# Patient Record
Sex: Female | Born: 1980 | Race: Black or African American | Hispanic: No | Marital: Single | Smoking: Never smoker
Health system: Southern US, Community
[De-identification: ages and names within clinical notes are randomized; demographics above are authoritative.]

## PROBLEM LIST (undated history)

## (undated) ENCOUNTER — Inpatient Hospital Stay (HOSPITAL_COMMUNITY): Payer: Self-pay

## (undated) DIAGNOSIS — Z903 Acquired absence of stomach [part of]: Secondary | ICD-10-CM

## (undated) DIAGNOSIS — Z789 Other specified health status: Secondary | ICD-10-CM

## (undated) HISTORY — PX: LAPAROSCOPIC GASTRIC SLEEVE RESECTION: SHX5895

---

## 2000-02-20 ENCOUNTER — Emergency Department (HOSPITAL_COMMUNITY): Admission: EM | Admit: 2000-02-20 | Discharge: 2000-02-20 | Payer: Self-pay | Admitting: *Deleted

## 2000-07-26 ENCOUNTER — Emergency Department (HOSPITAL_COMMUNITY): Admission: EM | Admit: 2000-07-26 | Discharge: 2000-07-26 | Payer: Self-pay | Admitting: Emergency Medicine

## 2000-10-03 ENCOUNTER — Other Ambulatory Visit: Admission: RE | Admit: 2000-10-03 | Discharge: 2000-10-03 | Payer: Self-pay | Admitting: Obstetrics and Gynecology

## 2001-12-15 ENCOUNTER — Emergency Department (HOSPITAL_COMMUNITY): Admission: EM | Admit: 2001-12-15 | Discharge: 2001-12-15 | Payer: Self-pay | Admitting: Emergency Medicine

## 2001-12-17 ENCOUNTER — Emergency Department (HOSPITAL_COMMUNITY): Admission: EM | Admit: 2001-12-17 | Discharge: 2001-12-17 | Payer: Self-pay | Admitting: *Deleted

## 2002-11-09 ENCOUNTER — Emergency Department (HOSPITAL_COMMUNITY): Admission: EM | Admit: 2002-11-09 | Discharge: 2002-11-10 | Payer: Self-pay | Admitting: Emergency Medicine

## 2002-11-10 ENCOUNTER — Encounter: Payer: Self-pay | Admitting: Emergency Medicine

## 2003-10-03 ENCOUNTER — Other Ambulatory Visit: Admission: RE | Admit: 2003-10-03 | Discharge: 2003-10-03 | Payer: Self-pay | Admitting: Obstetrics and Gynecology

## 2009-10-07 ENCOUNTER — Inpatient Hospital Stay (HOSPITAL_COMMUNITY): Admission: AD | Admit: 2009-10-07 | Discharge: 2009-10-07 | Payer: Self-pay | Admitting: Obstetrics and Gynecology

## 2009-11-09 ENCOUNTER — Ambulatory Visit (HOSPITAL_COMMUNITY): Admission: RE | Admit: 2009-11-09 | Discharge: 2009-11-09 | Payer: Self-pay | Admitting: Obstetrics

## 2010-01-20 ENCOUNTER — Ambulatory Visit (HOSPITAL_COMMUNITY): Admission: RE | Admit: 2010-01-20 | Discharge: 2010-01-20 | Payer: Self-pay | Admitting: Obstetrics

## 2010-04-06 ENCOUNTER — Ambulatory Visit (HOSPITAL_COMMUNITY): Admission: RE | Admit: 2010-04-06 | Discharge: 2010-04-06 | Payer: Self-pay | Admitting: Obstetrics

## 2010-05-12 ENCOUNTER — Ambulatory Visit (HOSPITAL_COMMUNITY): Admission: RE | Admit: 2010-05-12 | Discharge: 2010-05-12 | Payer: Self-pay | Admitting: Obstetrics

## 2010-05-25 ENCOUNTER — Inpatient Hospital Stay (HOSPITAL_COMMUNITY): Admission: AD | Admit: 2010-05-25 | Discharge: 2010-05-30 | Payer: Self-pay | Admitting: Obstetrics

## 2010-10-31 IMAGING — US US OB COMP LESS 14 WK
1 series · 14 of 19 positions shown · non-contrast
Comparison: none

OBSTETRICAL ULTRASOUND:
 This ultrasound exam was performed in the [HOSPITAL] Ultrasound Department.  The OB US report was generated in the AS system, and faxed to the ordering physician.  This report is also available in [HOSPITAL]?s Access[employer]ware and in [REDACTED] PACS.

[Series 1: us ob comp less 14 wks · 0.22mm/px · 14 of 19 slices shown]
[im 1/19]
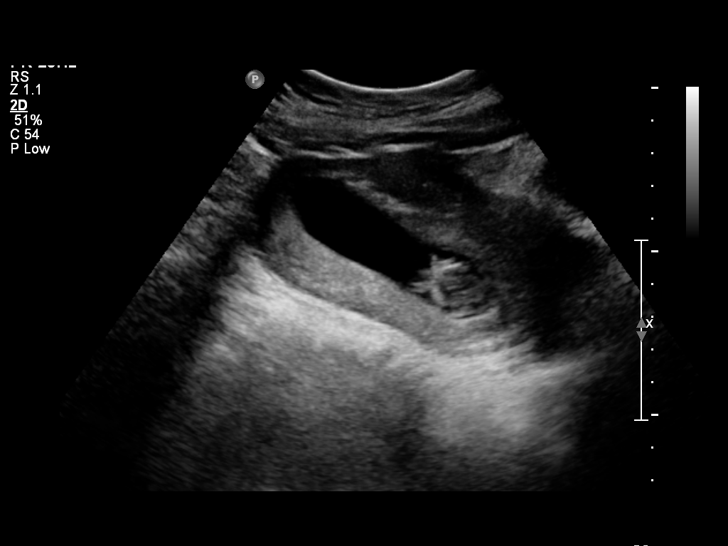
[im 3/19]
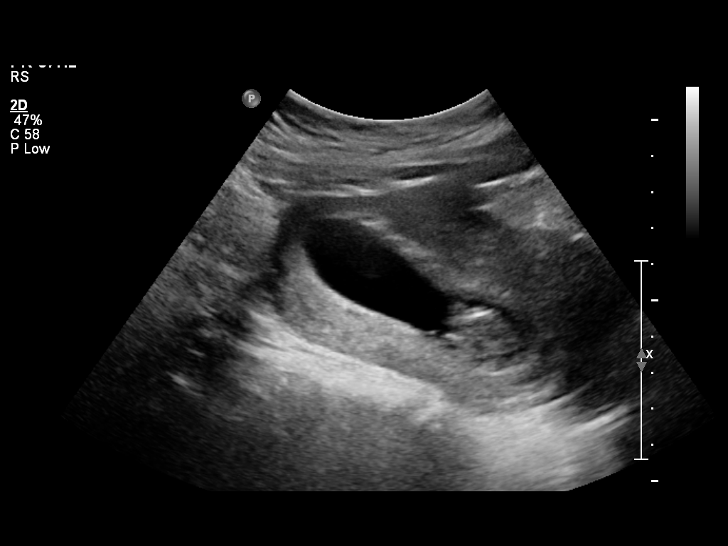
[im 4/19]
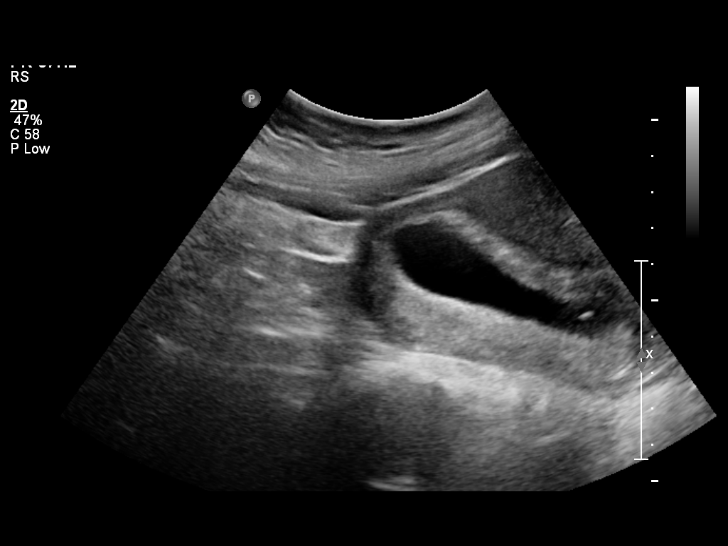
[im 5/19]
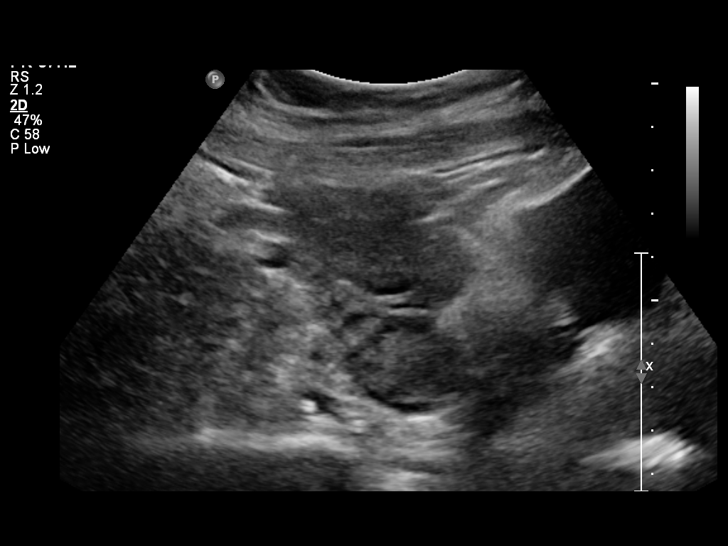
[im 7/19]
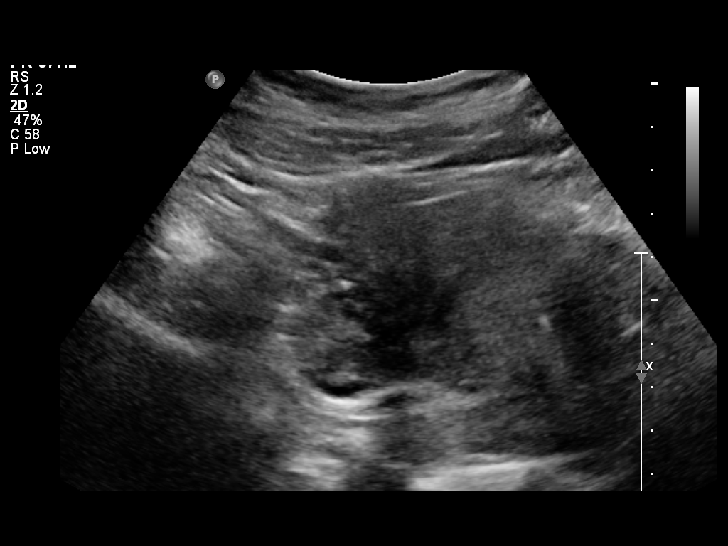
[im 8/19]
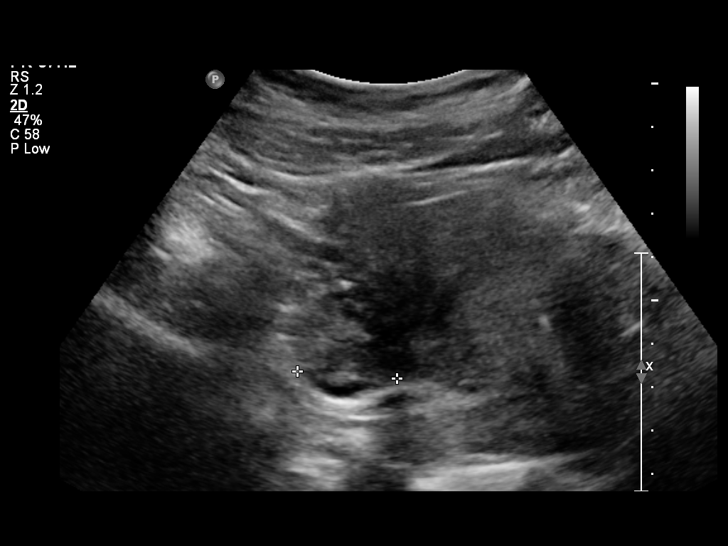
[im 9/19]
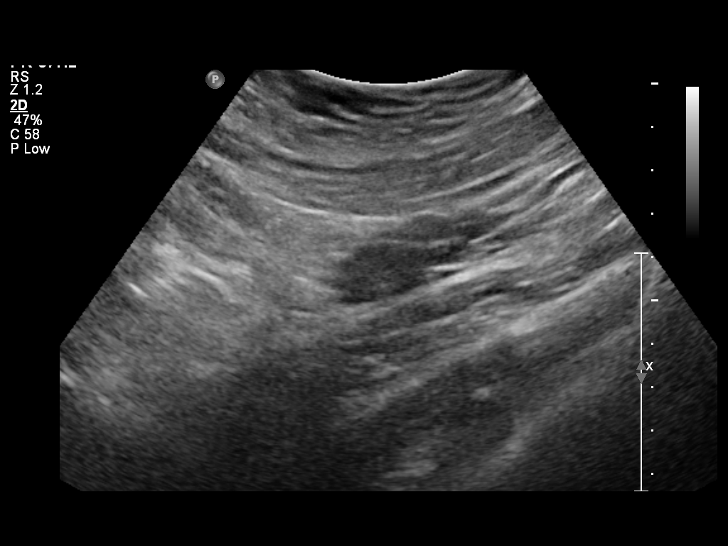
[im 11/19]
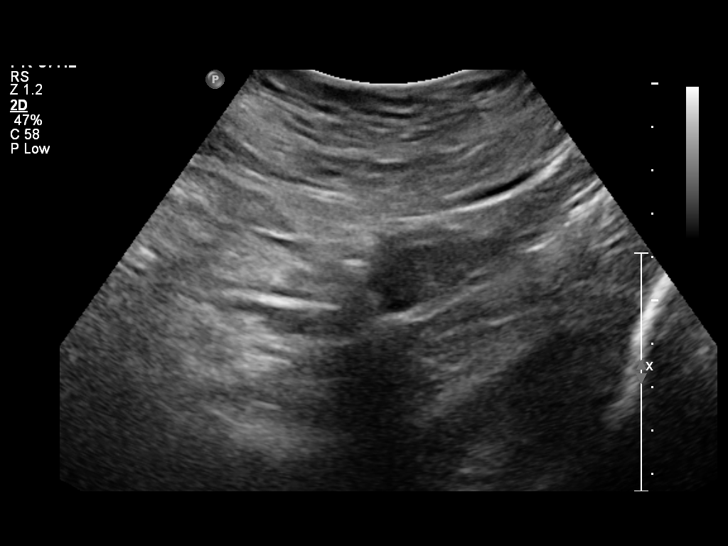
[im 12/19]
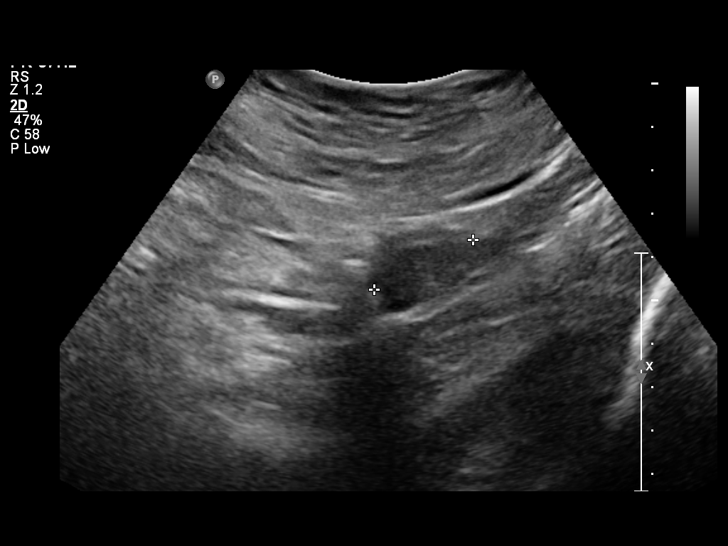
[im 13/19]
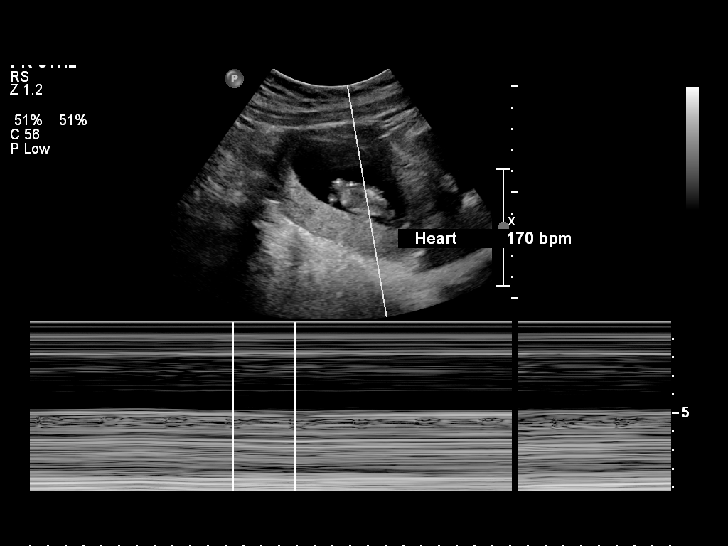
[im 15/19]
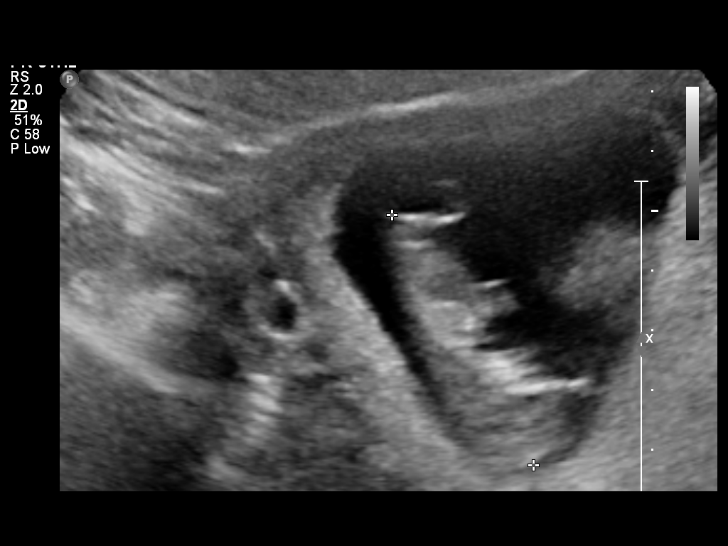
[im 16/19]
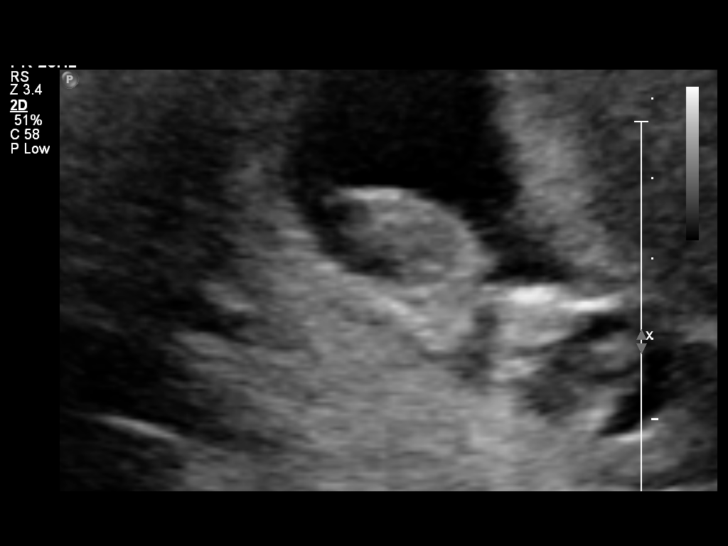
[im 17/19]
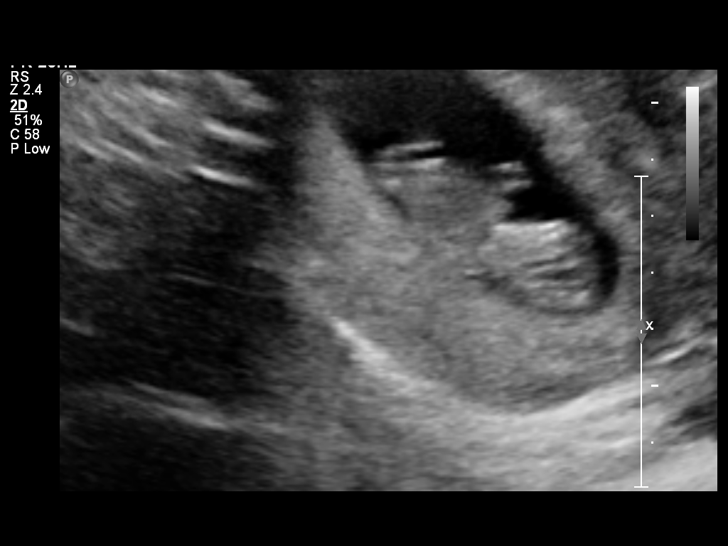
[im 19/19]
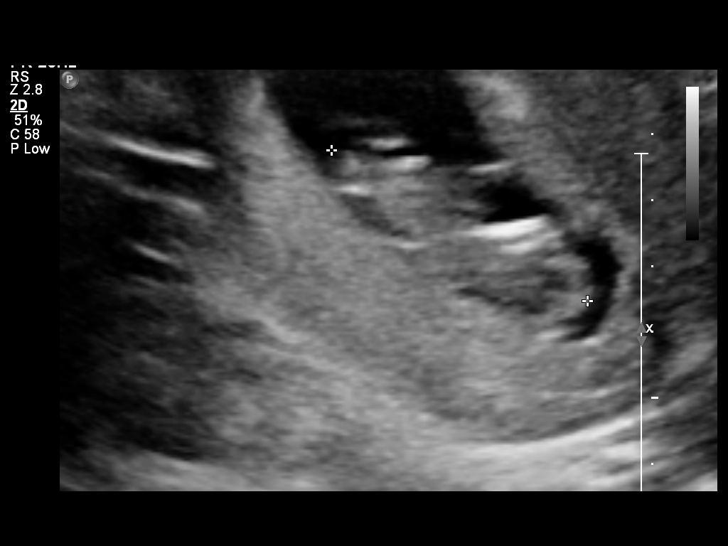

[14 of 19 positions shown; findings below may reference images not displayed]

IMPRESSION: See AS Obstetric US report.

## 2010-12-26 ENCOUNTER — Encounter: Payer: Self-pay | Admitting: Obstetrics

## 2011-01-11 IMAGING — US US OB DETAIL+14 WK
1 series · 14 of 28 positions shown · non-contrast
Comparison: none

OBSTETRICAL ULTRASOUND:
 This ultrasound exam was performed in the [HOSPITAL] Ultrasound Department.  The OB US report was generated in the AS system, and faxed to the ordering physician.  This report is also available in [HOSPITAL]?s [employer] and in [REDACTED] PACS.

[Series 1: us ob detail +14 wk · 0.22mm/px · 92 acquisitions, 14 frames shown]
[im 4/92]
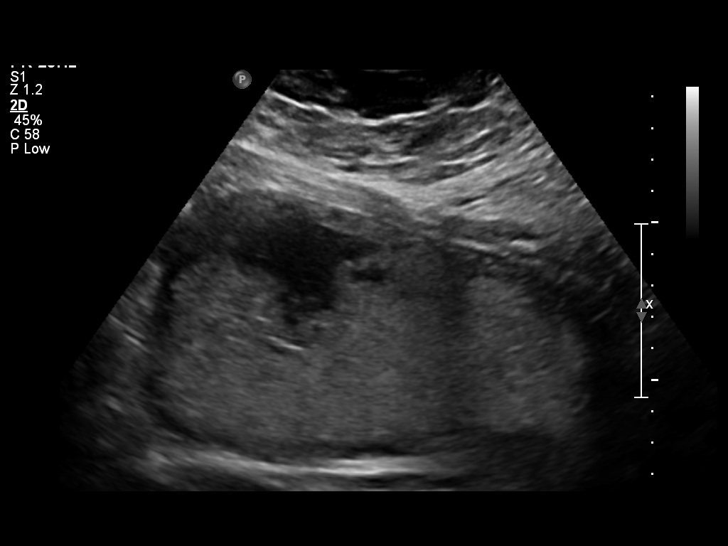
[im 11/92]
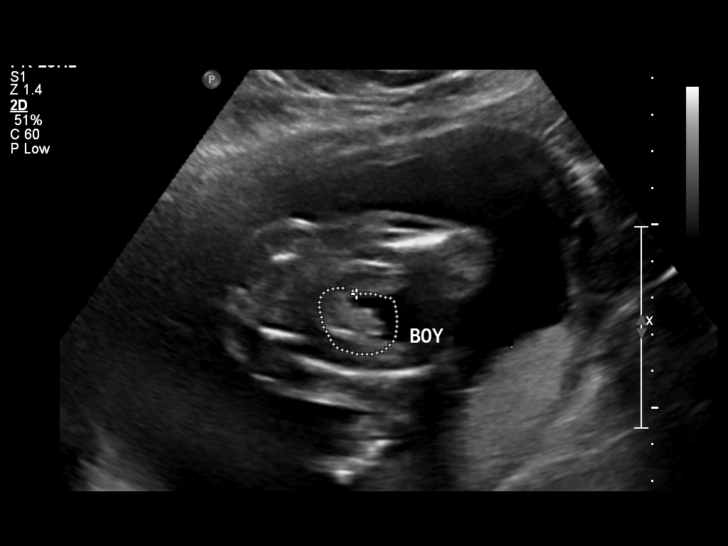
[im 17/92]
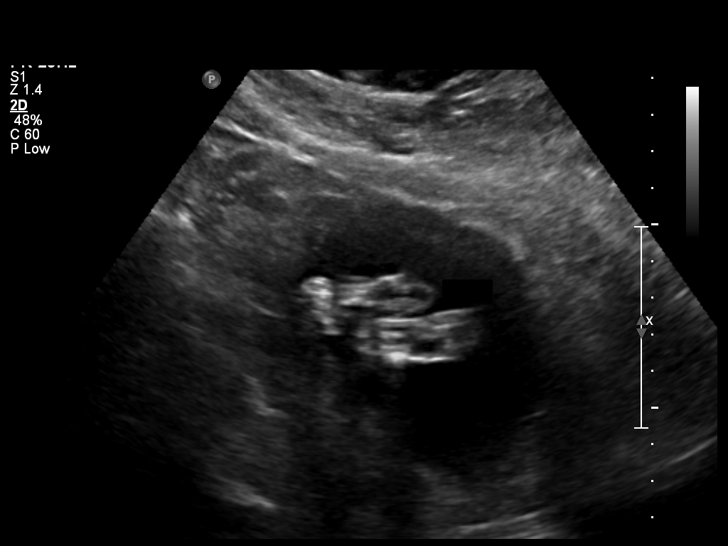
[im 24/92]
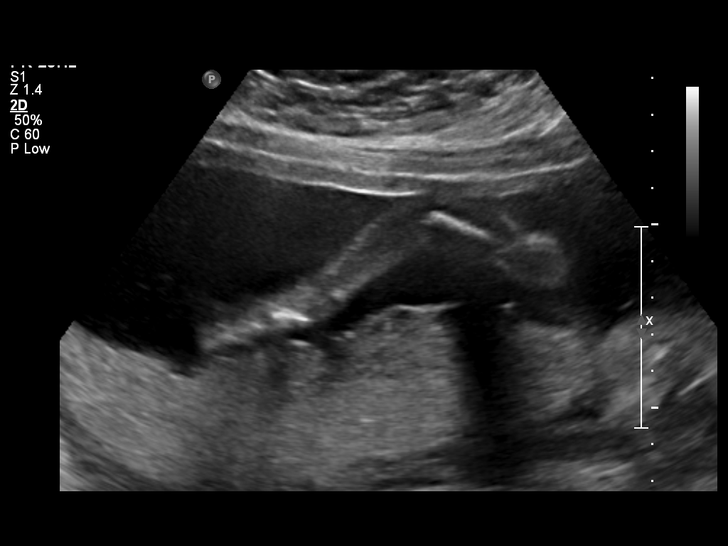
[im 31/92]
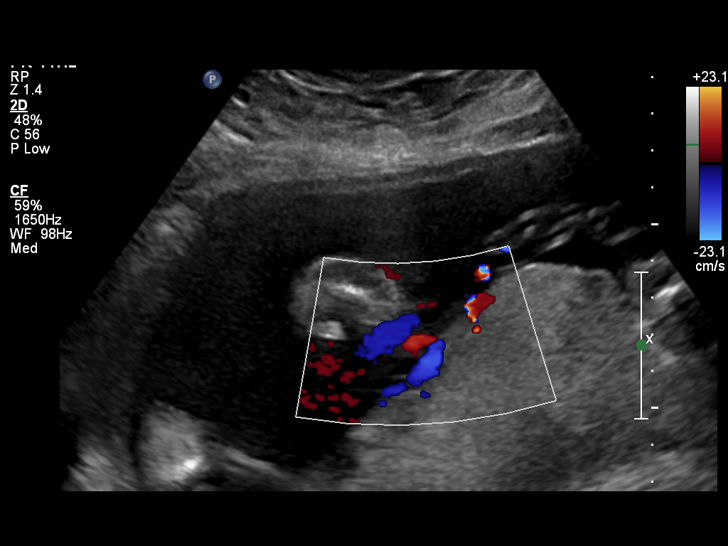
[im 38/92]
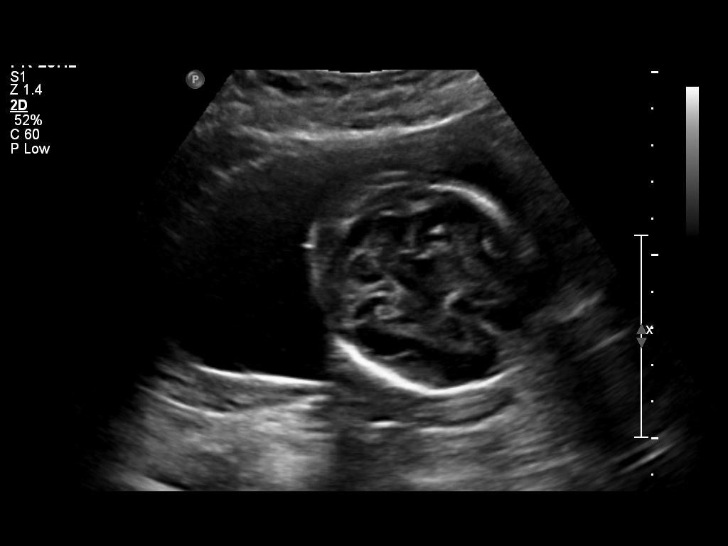
[im 44/92]
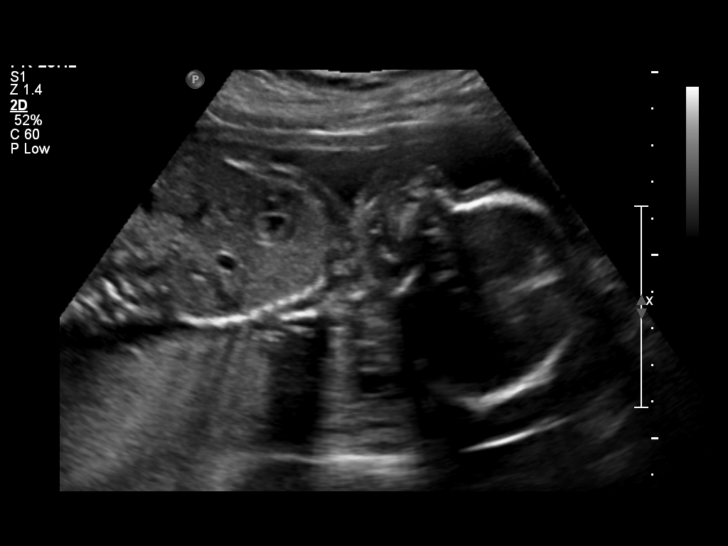
[im 51/92]
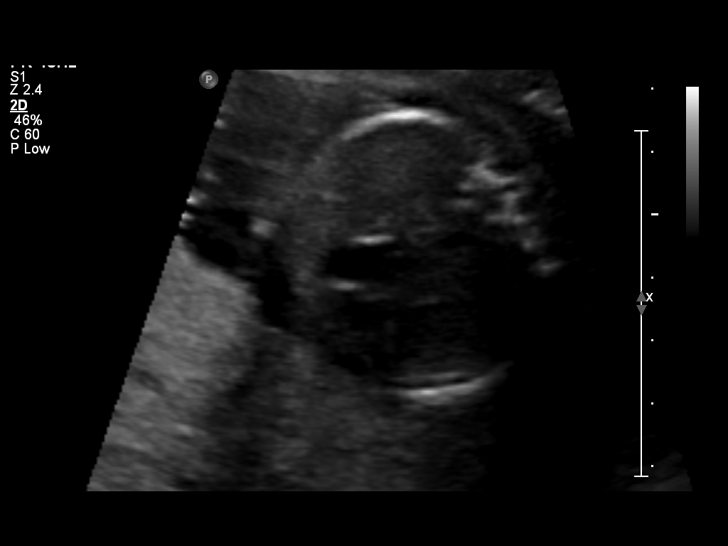
[im 58/92]
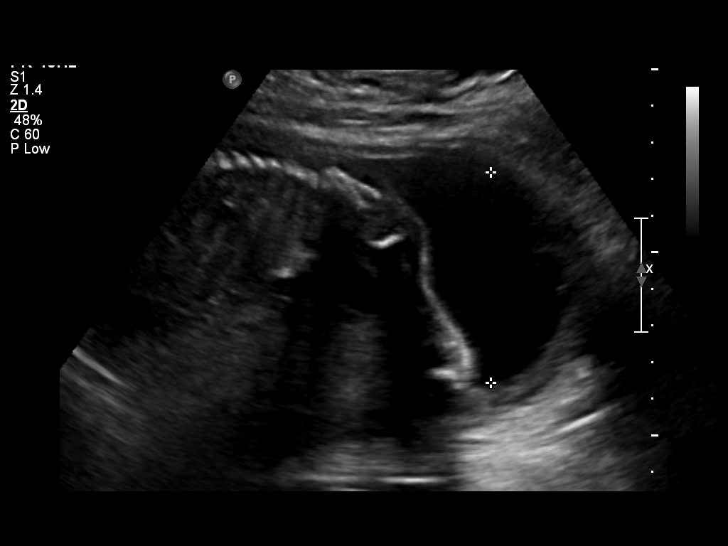
[im 65/92]
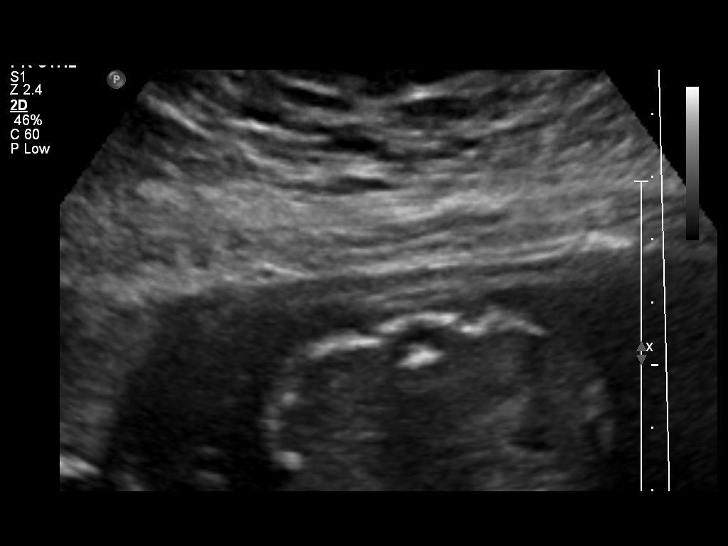
[im 71/92]
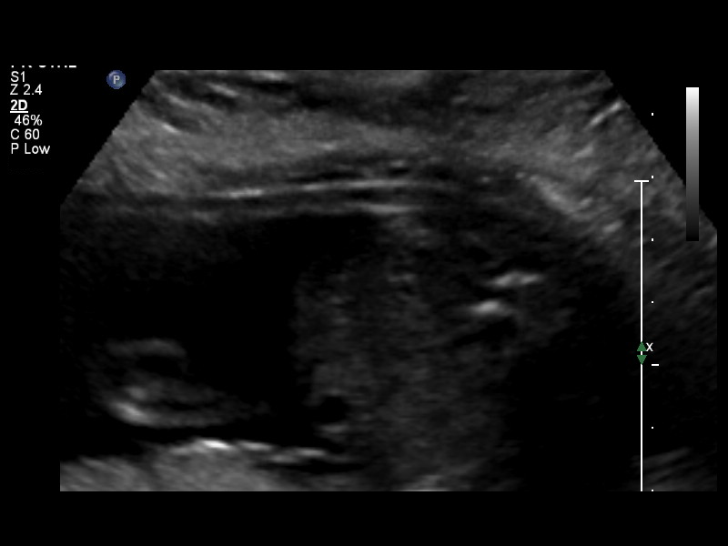
[im 78/92]
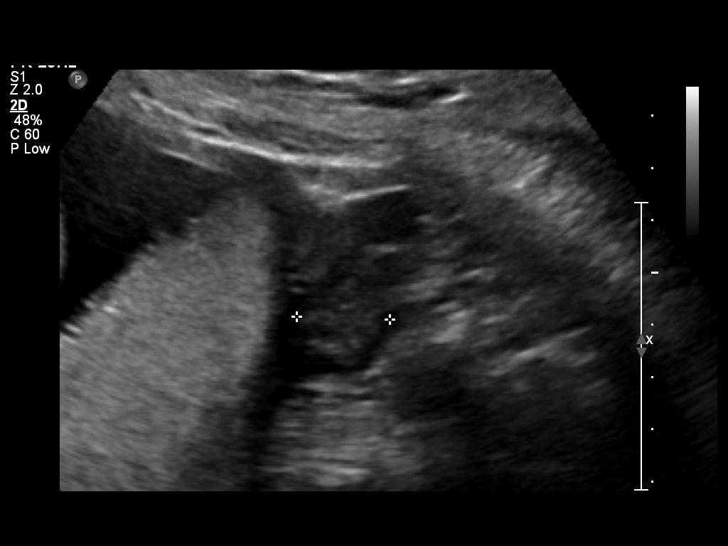
[im 85/92]
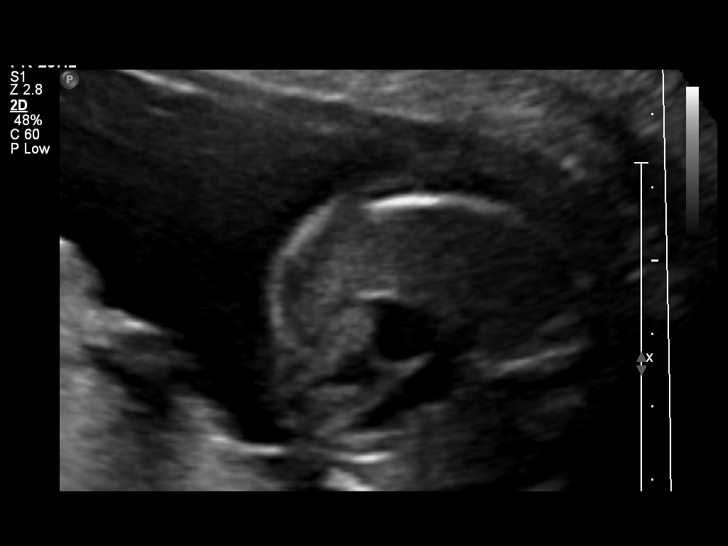
[im 92/92]
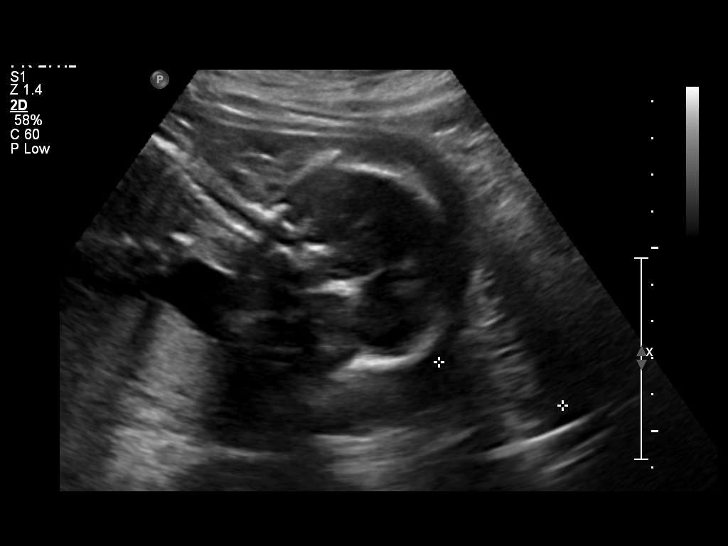

[14 of 28 positions shown; findings below may reference images not displayed]

IMPRESSION: See AS Obstetric US report.

## 2011-02-20 LAB — CBC
HCT: 30 % — ABNORMAL LOW (ref 36.0–46.0)
Hemoglobin: 10.1 g/dL — ABNORMAL LOW (ref 12.0–15.0)
MCHC: 33.8 g/dL (ref 30.0–36.0)
MCV: 86.7 fL (ref 78.0–100.0)
RBC: 3.45 MIL/uL — ABNORMAL LOW (ref 3.87–5.11)
RBC: 4.1 MIL/uL (ref 3.87–5.11)
WBC: 10.8 10*3/uL — ABNORMAL HIGH (ref 4.0–10.5)
WBC: 12.1 10*3/uL — ABNORMAL HIGH (ref 4.0–10.5)

## 2011-02-20 LAB — RPR: RPR Ser Ql: NONREACTIVE

## 2011-03-09 LAB — URINALYSIS, ROUTINE W REFLEX MICROSCOPIC
Glucose, UA: NEGATIVE mg/dL
Hgb urine dipstick: NEGATIVE
Specific Gravity, Urine: >1.03 — ABNORMAL HIGH (ref 1.005–1.030)
pH: 6 (ref 5.0–8.0)

## 2011-03-09 LAB — POCT PREGNANCY, URINE

## 2012-12-03 ENCOUNTER — Encounter (HOSPITAL_COMMUNITY): Payer: Self-pay | Admitting: *Deleted

## 2012-12-03 ENCOUNTER — Inpatient Hospital Stay (HOSPITAL_COMMUNITY)
Admission: AD | Admit: 2012-12-03 | Discharge: 2012-12-03 | Disposition: A | Discharge status: Home / Self Care | Payer: Self-pay | Source: Ambulatory Visit | Attending: Obstetrics and Gynecology | Admitting: Obstetrics and Gynecology

## 2012-12-03 DIAGNOSIS — R109 Unspecified abdominal pain: Secondary | ICD-10-CM | POA: Insufficient documentation

## 2012-12-03 DIAGNOSIS — K5900 Constipation, unspecified: Secondary | ICD-10-CM | POA: Insufficient documentation

## 2012-12-03 DIAGNOSIS — K5904 Chronic idiopathic constipation: Secondary | ICD-10-CM

## 2012-12-03 LAB — URINALYSIS, ROUTINE W REFLEX MICROSCOPIC
Leukocytes, UA: NEGATIVE
Nitrite: NEGATIVE
Specific Gravity, Urine: 1.015 (ref 1.005–1.030)
Urobilinogen, UA: 1 mg/dL (ref 0.0–1.0)
pH: 7.5 (ref 5.0–8.0)

## 2012-12-03 LAB — CBC WITH DIFFERENTIAL/PLATELET
Basophils Absolute: 0 10*3/uL (ref 0.0–0.1)
Eosinophils Absolute: 0.1 10*3/uL (ref 0.0–0.7)
Eosinophils Relative: 1 % (ref 0–5)
Lymphocytes Relative: 36 % (ref 12–46)
Lymphs Abs: 2.6 10*3/uL (ref 0.7–4.0)
MCH: 28.1 pg (ref 26.0–34.0)
Neutrophils Relative %: 54 % (ref 43–77)
Platelets: 308 10*3/uL (ref 150–400)
RBC: 4.56 MIL/uL (ref 3.87–5.11)
RDW: 13.7 % (ref 11.5–15.5)
WBC: 7.2 10*3/uL (ref 4.0–10.5)

## 2012-12-03 LAB — HCG, SERUM, QUALITATIVE: Preg, Serum: NEGATIVE

## 2012-12-03 NOTE — MAU Note (Signed)
Pt reports since end of sept she has gained a lot of weight and has had abd "swelling", has continued to have a period but they have been lighter and shorter. preg test negative in Oct. LMP 11/17/2012 which was 2 weeks early. Pt states she feels like she is preg.

## 2012-12-03 NOTE — MAU Provider Note (Signed)
History     CSN: [id number]  Arrival date and time: 12/03/12 1910  Provider patient contact 2015    Chief Complaint  Patient presents with  . Possible Pregnancy   HPI Michelle Burns is a 31 y.o. G1 P1001 who presents to MAU today with complaint of abdominal pain and possible pregnancy. The patient states that she feels like she did during her previous pregnancy but has had numerous negative home pregnancy tests. The patient's abdominal pain is "sometimes upper and sometimes lower" abdomen. It feels like "movement." She does have occasional nausea around meals, but denies vomiting or diarrhea. She does have occasional constipation and fatigue.   OB History    Grav Para Term Preterm Abortions TAB SAB Ect Mult Living   1 1 1       1       History reviewed. No pertinent past medical history.  Past Surgical History  Procedure Date  . Cesarean section     Family History  Problem Relation Age of Onset  . Diabetes Mother   . Hypertension Mother   . Diabetes Father   . Hypertension Father     History  Substance Use Topics  . Smoking status: Never Smoker   . Smokeless tobacco: Not on file  . Alcohol Use: 0.6 oz/week    1 Shots of liquor per week    Allergies: No Known Allergies  Prescriptions prior to admission  Medication Sig Dispense Refill  . diphenhydrAMINE (BENADRYL) 25 mg capsule Take 25 mg by mouth every 6 (six) hours as needed. allergy      . [DISCONTINUED] OVER THE COUNTER MEDICATION Diet pill        ROS All negative unless otherwise noted in HPI Physical Exam   Blood pressure 129/83, pulse 88, temperature 98.6 F (37 C), temperature source Oral, resp. rate 20, height 5' (1.524 m), weight 239 lb (108.41 kg), last menstrual period 11/17/2012, SpO2 100.00%.  Physical Exam  Constitutional: She is oriented to person, place, and time. She appears well-developed and well-nourished. No distress.  HENT:  Head: Normocephalic and atraumatic.    Cardiovascular: Normal rate, regular rhythm and normal heart sounds.   Respiratory: Effort normal and breath sounds normal. No respiratory distress.  GI: Soft. Bowel sounds are normal. She exhibits no distension and no mass. There is no tenderness. There is no rebound and no guarding.  Neurological: She is alert and oriented to person, place, and time.  Skin: Skin is warm and dry. No erythema.  Psychiatric: She has a normal mood and affect.    MAU Course  Procedures None  Results for orders placed during the hospital encounter of 12/03/12 (from the past 24 hour(s))  URINALYSIS, ROUTINE W REFLEX MICROSCOPIC     Status: Normal   Collection Time   12/03/12  7:25 PM      Component Value Range   Color, Urine YELLOW  YELLOW   APPearance CLEAR  CLEAR   Specific Gravity, Urine 1.015  1.005 - 1.030   pH 7.5  5.0 - 8.0   Glucose, UA NEGATIVE  NEGATIVE mg/dL   Hgb urine dipstick NEGATIVE  NEGATIVE   Bilirubin Urine NEGATIVE  NEGATIVE   Ketones, ur NEGATIVE  NEGATIVE mg/dL   Protein, ur NEGATIVE  NEGATIVE mg/dL   Urobilinogen, UA 1.0  0.0 - 1.0 mg/dL   Nitrite NEGATIVE  NEGATIVE   Leukocytes, UA NEGATIVE  NEGATIVE  POCT PREGNANCY, URINE     Status: Normal   Collection Time  12/03/12  7:36 PM      Component Value Range   Preg Test, Ur NEGATIVE  NEGATIVE  CBC WITH DIFFERENTIAL     Status: Normal   Collection Time   12/03/12  8:00 PM      Component Value Range   WBC 7.2  4.0 - 10.5 K/uL   RBC 4.56  3.87 - 5.11 MIL/uL   Hemoglobin 12.8  12.0 - 15.0 g/dL   HCT 16.1  09.6 - 04.5 %   MCV 86.8  78.0 - 100.0 fL   MCH 28.1  26.0 - 34.0 pg   MCHC 32.3  30.0 - 36.0 g/dL   RDW 40.9  81.1 - 91.4 %   Platelets 308  150 - 400 K/uL   Neutrophils Relative 54  43 - 77 %   Neutro Abs 3.9  1.7 - 7.7 K/uL   Lymphocytes Relative 36  12 - 46 %   Lymphs Abs 2.6  0.7 - 4.0 K/uL   Monocytes Relative 8  3 - 12 %   Monocytes Absolute 0.6  0.1 - 1.0 K/uL   Eosinophils Relative 1  0 - 5 %   Eosinophils  Absolute 0.1  0.0 - 0.7 K/uL   Basophils Relative 0  0 - 1 %   Basophils Absolute 0.0  0.0 - 0.1 K/uL  HCG, SERUM, QUALITATIVE     Status: Normal   Collection Time   12/03/12  8:00 PM      Component Value Range   Preg, Serum NEGATIVE  NEGATIVE    Assessment and Plan  A: Functional constipation Non-pregnant   P: Discharge home Referral to GI for further evaluation of constipation and abdominal discomfort Increase in dietary fiber encouraged Patient may go to urgent care, [facility] or [facility] if problems worsen   Freddi Starr, PA-C 12/03/2012, 8:57 PM

## 2012-12-06 NOTE — MAU Provider Note (Signed)
Attestation of Attending Supervision of Advanced Practitioner (CNM/NP): Evaluation and management procedures were performed by the Advanced Practitioner under my supervision and collaboration.  I have reviewed the Advanced Practitioner's note and chart, and I agree with the management and plan.  Maggie Dworkin 12/06/2012 9:02 AM

## 2014-10-06 ENCOUNTER — Encounter (HOSPITAL_COMMUNITY): Payer: Self-pay | Admitting: *Deleted

## 2016-12-01 ENCOUNTER — Encounter (HOSPITAL_COMMUNITY): Payer: Self-pay | Admitting: *Deleted

## 2016-12-01 ENCOUNTER — Ambulatory Visit (HOSPITAL_COMMUNITY)
Admission: EM | Admit: 2016-12-01 | Discharge: 2016-12-01 | Disposition: A | Discharge status: Home / Self Care | Payer: BLUE CROSS/BLUE SHIELD | Attending: Family Medicine | Admitting: Family Medicine

## 2016-12-01 DIAGNOSIS — H8110 Benign paroxysmal vertigo, unspecified ear: Secondary | ICD-10-CM

## 2016-12-01 HISTORY — DX: Acquired absence of stomach (part of): Z90.3

## 2016-12-01 MED ORDER — MECLIZINE HCL 25 MG PO TABS: 25.0000 mg | ORAL_TABLET | Freq: Three times a day (TID) | ORAL | 0 refills | Status: DC | PRN

## 2016-12-01 NOTE — Discharge Instructions (Signed)
Take the Antivert as directed. This may cause some drowsiness. Your record indicates that you are or have been taking Benadryl. Do not take Benadryl or other antihistamines with this medicine.  Drink plenty of fluids and stay well-hydrated. Recommend he stay home tomorrow and rest and maintain positions that minimized her symptoms. For any worsening, new symptoms or problems such as severe headache weakness or numbness, moderate to severe vomiting or other problems go to emergency department.

## 2016-12-01 NOTE — ED Provider Notes (Signed)
CSN: [id number]     Arrival date & time 12/01/16  1625 History   First MD Initiated Contact with Patient 12/01/16 1826     Chief Complaint  Patient presents with  . Dizziness   (Consider location/radiation/quality/duration/timing/severity/associated sxs/prior Treatment) 35 year old female complaining of dizziness and vertigo for 5 days. States she awoke lying down supine and the room was spinning. She has occasional problems with balance. Symptoms are exacerbated or elicited by body position changes or turning her head quickly. Denies nausea but did have vomiting a couple days ago. She is unsure as to whether it was associated with the dizziness or her recent gastric bypass surgery. Denies headache or other neurologic symptoms.      Past Medical History:  Diagnosis Date  . History of sleeve gastrectomy    Past Surgical History:  Procedure Laterality Date  . CESAREAN SECTION     Family History  Problem Relation Age of Onset  . Diabetes Mother   . Hypertension Mother   . Diabetes Father   . Hypertension Father    Social History  Substance Use Topics  . Smoking status: Never Smoker  . Smokeless tobacco: Never Used  . Alcohol use 0.6 oz/week    1 Shots of liquor per week   OB History    Gravida Para Term Preterm AB Living   1 1 1     1    SAB TAB Ectopic Multiple Live Births                 Review of Systems  Constitutional: Positive for activity change. Negative for fever.  HENT: Negative.   Eyes: Negative.   Respiratory: Negative.   Cardiovascular: Negative for chest pain.  Gastrointestinal: Negative.   Skin: Negative.   Neurological: Positive for dizziness. Negative for tremors, syncope, speech difficulty, weakness, numbness and headaches.  Psychiatric/Behavioral: Negative.     Allergies  Patient has no known allergies.  Home Medications   Prior to Admission medications   Medication Sig Start Date End Date Taking? Authorizing Provider  omeprazole  (PRILOSEC) 10 MG capsule Take 10 mg by mouth daily.   Yes Historical Provider, MD  meclizine (ANTIVERT) 25 MG tablet Take 1 tablet (25 mg total) by mouth 3 (three) times daily as needed for dizziness. 12/01/16   Hayden Rasmussenavid Tauheed Mcfayden, NP   Meds Ordered and Administered this Visit  Medications - No data to display  BP 104/59 (BP Location: Right Arm)   Pulse 79   Temp 98 F (36.7 C) (Oral)   Resp 16   LMP 11/07/2016   SpO2 100%  No data found.   Physical Exam  Constitutional: She is oriented to person, place, and time. She appears well-developed and well-nourished. No distress.  HENT:  Head: Normocephalic and atraumatic.  Right Ear: External ear normal.  Left Ear: External ear normal.  Mouth/Throat: Oropharynx is clear and moist. No oropharyngeal exudate.  Eyes: Conjunctivae and EOM are normal. Pupils are equal, round, and reactive to light. Right eye exhibits no discharge. Left eye exhibits no discharge.  Neck: Normal range of motion. Neck supple.  Cardiovascular: Normal rate, regular rhythm, normal heart sounds and intact distal pulses.   Pulmonary/Chest: Effort normal and breath sounds normal. No respiratory distress.  Abdominal: There is no tenderness.  Musculoskeletal: Normal range of motion. She exhibits no edema or tenderness.  Lymphadenopathy:    She has no cervical adenopathy.  Neurological: She is alert and oriented to person, place, and time. She has normal strength. She  displays no tremor. No cranial nerve deficit or sensory deficit. She exhibits normal muscle tone. Gait normal. GCS eye subscore is 4. GCS verbal subscore is 5. GCS motor subscore is 6.  Skin: Skin is warm and dry. No rash noted.  Psychiatric: She has a normal mood and affect. Thought content normal.  Nursing note and vitals reviewed.   Urgent Care Course   Clinical Course     Procedures (including critical care time)  Labs Review Labs Reviewed - No data to display  Imaging Review No results  found.   Visual Acuity Review  Right Eye Distance:   Left Eye Distance:   Bilateral Distance:    Right Eye Near:   Left Eye Near:    Bilateral Near:         MDM   1. BPV (benign positional vertigo), unspecified laterality    Take the Antivert as directed. This may cause some drowsiness. Your record indicates that you are or have been taking Benadryl. Do not take Benadryl or other antihistamines with this medicine.  Drink plenty of fluids and stay well-hydrated. Recommend he stay home tomorrow and rest and maintain positions that minimized her symptoms. For any worsening, new symptoms or problems such as severe headache weakness or numbness, moderate to severe vomiting or other problems go to emergency department. Meds ordered this encounter  Medications  . omeprazole (PRILOSEC) 10 MG capsule    Sig: Take 10 mg by mouth daily.  . meclizine (ANTIVERT) 25 MG tablet    Sig: Take 1 tablet (25 mg total) by mouth 3 (three) times daily as needed for dizziness.    Dispense:  30 tablet    Refill:  0    Order Specific Question:   Supervising Provider    Answer:   Linna HoffKINDL, JAMES D [[id number]]       Hayden Rasmussenavid Donnarae Rae, NP 12/01/16 [phone]

## 2016-12-01 NOTE — ED Triage Notes (Addendum)
Patient reports dizziness since the 24th, dizziness is associated with position changes. No ear pain or ringing. Patient had gastric sleeve surgery on the 4th of December, no complications. Reports only taking prilosec and multiple vitamins at this time.

## 2019-01-03 ENCOUNTER — Encounter (HOSPITAL_COMMUNITY): Payer: Self-pay | Admitting: *Deleted

## 2019-01-03 ENCOUNTER — Inpatient Hospital Stay (HOSPITAL_COMMUNITY): Payer: BLUE CROSS/BLUE SHIELD

## 2019-01-03 ENCOUNTER — Inpatient Hospital Stay (HOSPITAL_COMMUNITY)
Admission: AD | Admit: 2019-01-03 | Discharge: 2019-01-03 | Disposition: A | Discharge status: Home / Self Care | Payer: BLUE CROSS/BLUE SHIELD | Attending: Obstetrics and Gynecology | Admitting: Obstetrics and Gynecology

## 2019-01-03 ENCOUNTER — Other Ambulatory Visit: Payer: Self-pay

## 2019-01-03 DIAGNOSIS — Z3A01 Less than 8 weeks gestation of pregnancy: Secondary | ICD-10-CM | POA: Diagnosis not present

## 2019-01-03 DIAGNOSIS — O4690 Antepartum hemorrhage, unspecified, unspecified trimester: Secondary | ICD-10-CM

## 2019-01-03 DIAGNOSIS — O99841 Bariatric surgery status complicating pregnancy, first trimester: Secondary | ICD-10-CM | POA: Insufficient documentation

## 2019-01-03 DIAGNOSIS — O3680X0 Pregnancy with inconclusive fetal viability, not applicable or unspecified: Secondary | ICD-10-CM | POA: Diagnosis not present

## 2019-01-03 DIAGNOSIS — Z6790 Unspecified blood type, Rh positive: Secondary | ICD-10-CM

## 2019-01-03 DIAGNOSIS — O209 Hemorrhage in early pregnancy, unspecified: Secondary | ICD-10-CM | POA: Diagnosis present

## 2019-01-03 HISTORY — DX: Other specified health status: Z78.9

## 2019-01-03 LAB — URINALYSIS, ROUTINE W REFLEX MICROSCOPIC
Glucose, UA: NEGATIVE mg/dL
KETONES UR: 15 mg/dL — AB
LEUKOCYTES UA: NEGATIVE
Nitrite: NEGATIVE
PH: 5 (ref 5.0–8.0)
Protein, ur: NEGATIVE mg/dL
Specific Gravity, Urine: >1.03 — ABNORMAL HIGH (ref 1.005–1.030)

## 2019-01-03 LAB — ABO/RH: ABO/RH(D): O POS

## 2019-01-03 LAB — WET PREP, GENITAL
Clue Cells Wet Prep HPF POC: NONE SEEN
Sperm: NONE SEEN
Trich, Wet Prep: NONE SEEN
YEAST WET PREP: NONE SEEN

## 2019-01-03 LAB — URINALYSIS, MICROSCOPIC (REFLEX)

## 2019-01-03 LAB — CBC
HCT: 40.1 % (ref 36.0–46.0)
Hemoglobin: 13.1 g/dL (ref 12.0–15.0)
MCH: 28.8 pg (ref 26.0–34.0)
MCHC: 32.7 g/dL (ref 30.0–36.0)
MCV: 88.1 fL (ref 80.0–100.0)
PLATELETS: 294 10*3/uL (ref 150–400)
RBC: 4.55 MIL/uL (ref 3.87–5.11)
RDW: 13.2 % (ref 11.5–15.5)
WBC: 4.7 10*3/uL (ref 4.0–10.5)
nRBC: 0 % (ref 0.0–0.2)

## 2019-01-03 LAB — HCG, QUANTITATIVE, PREGNANCY: hCG, Beta Chain, Quant, S: 114 m[IU]/mL — ABNORMAL HIGH (ref <5)

## 2019-01-03 NOTE — Discharge Instructions (Signed)
Vaginal Bleeding During Pregnancy, First Trimester    A small amount of bleeding (spotting) from the vagina is common during early pregnancy. Sometimes the bleeding is normal and does not cause problems. At other times, though, bleeding may be a sign of something serious. Tell your doctor about any bleeding from your vagina right away.  Follow these instructions at home:  Activity  · Follow your doctor's instructions about how active you can be.  · If needed, make plans for someone to help with your normal activities.  · Do not have sex or orgasms until your doctor says that this is safe.  General instructions  · Take over-the-counter and prescription medicines only as told by your doctor.  · Watch your condition for any changes.  · Write down:  ? The number of pads you use each day.  ? How often you change pads.  ? How soaked (saturated) your pads are.  · Do not use tampons.  · Do not douche.  · If you pass any tissue from your vagina, save it to show to your doctor.  · Keep all follow-up visits as told by your doctor. This is important.  Contact a doctor if:  · You have vaginal bleeding at any time while you are pregnant.  · You have cramps.  · You have a fever.  Get help right away if:  · You have very bad cramps in your back or belly (abdomen).  · You pass large clots or a lot of tissue from your vagina.  · Your bleeding gets worse.  · You feel light-headed.  · You feel weak.  · You pass out (faint).  · You have chills.  · You are leaking fluid from your vagina.  · You have a gush of fluid from your vagina.  Summary  · Sometimes vaginal bleeding during pregnancy is normal and does not cause problems. At other times, bleeding may be a sign of something serious.  · Tell your doctor about any bleeding from your vagina right away.  · Follow your doctor's instructions about how active you can be. You may need someone to help you with your normal activities.  This information is not intended to replace advice given to  you by your health care provider. Make sure you discuss any questions you have with your health care provider.  Document Released: 04/07/2014 Document Revised: 02/22/2017 Document Reviewed: 02/22/2017  Elsevier Interactive Patient Education © 2019 [employer]

## 2019-01-03 NOTE — MAU Note (Addendum)
+  HPT on Tues.  Went to UC for bleeding on Tues.  preg confirmed; pt has paperwork with her confirming that.  No [place]. Was told if heavier to get seen.  No pain

## 2019-01-03 NOTE — MAU Provider Note (Signed)
History     CSN: [id number]  Arrival date and time: 01/03/19 9381   First Provider Initiated Contact with Patient 01/03/19 [phone]     Chief Complaint  Patient presents with  . Vaginal Bleeding   G4P1021 @[redacted]w[redacted]d  by LMP presenting with VB. Bleeding started 3 days ago. Amount is comparable to menses, changing several pads per day. Denies cramping or abd pain.   OB History    Gravida  4   Para  1   Term  1   Preterm      AB  2   Living  1     SAB      TAB  1   Ectopic      Multiple      Live Births              Past Medical History:  Diagnosis Date  . History of sleeve gastrectomy   . Medical history non-contributory     Past Surgical History:  Procedure Laterality Date  . CESAREAN SECTION    . LAPAROSCOPIC GASTRIC SLEEVE RESECTION      Family History  Problem Relation Age of Onset  . Hypertension Mother   . Diabetes Father   . Hypertension Father     Social History   Tobacco Use  . Smoking status: Never Smoker  . Smokeless tobacco: Never Used  Substance Use Topics  . Alcohol use: Yes    Alcohol/week: 1.0 standard drinks    Types: 1 Shots of liquor per week    Comment: Socially  . Drug use: Never    Allergies: No Known Allergies  No medications prior to admission.    Review of Systems  Constitutional: Negative for chills and fever.  Gastrointestinal: Negative for abdominal pain.  Genitourinary: Positive for vaginal bleeding.   Physical Exam   Blood pressure 100/67, pulse 81, temperature 98.4 F (36.9 C), temperature source Oral, resp. rate 19, height 5' (1.524 m), weight 74.8 kg, last menstrual period 11/25/2018, SpO2 100 %.  Physical Exam  Nursing note and vitals reviewed. Constitutional: She is oriented to person, place, and time. She appears well-developed and well-nourished. No distress.  HENT:  Head: Normocephalic and atraumatic.  Neck: Normal range of motion.  Cardiovascular: Normal rate.  Respiratory: Effort normal. No  respiratory distress.  GI: Soft. She exhibits no distension and no mass. There is no abdominal tenderness. There is no rebound and no guarding.  Genitourinary:    Genitourinary Comments: External: no lesions or erythema Vagina: rugated, pink, moist, mod red bloody discharge, cleared with 2 fox swabs Uterus: non enlarged, anteverted, non tender, no CMT Adnexae: no masses, no tenderness left, no tenderness right Cervix closed    Musculoskeletal: Normal range of motion.  Neurological: She is alert and oriented to person, place, and time.  Skin: Skin is warm and dry.  Psychiatric: She has a normal mood and affect.   Results for orders placed or performed during the hospital encounter of 01/03/19 (from the past 24 hour(s))  Urinalysis, Routine w reflex microscopic     Status: Abnormal   Collection Time: 01/03/19  8:56 AM  Result Value Ref Range   Color, Urine YELLOW YELLOW   APPearance CLEAR CLEAR   Specific Gravity, Urine >1.030 (H) 1.005 - 1.030   pH 5.0 5.0 - 8.0   Glucose, UA NEGATIVE NEGATIVE mg/dL   Hgb urine dipstick LARGE (A) NEGATIVE   Bilirubin Urine SMALL (A) NEGATIVE   Ketones, ur 15 (A) NEGATIVE mg/dL  Protein, ur NEGATIVE NEGATIVE mg/dL   Nitrite NEGATIVE NEGATIVE   Leukocytes, UA NEGATIVE NEGATIVE  Urinalysis, Microscopic (reflex)     Status: Abnormal   Collection Time: 01/03/19  8:56 AM  Result Value Ref Range   RBC / HPF 21-50 0 - 5 RBC/hpf   WBC, UA 0-5 0 - 5 WBC/hpf   Bacteria, UA FEW (A) NONE SEEN   Squamous Epithelial / LPF 0-5 0 - 5   Urine-Other MUCOUS PRESENT   ABO/Rh     Status: None (Preliminary result)   Collection Time: 01/03/19  9:17 AM  Result Value Ref Range   ABO/RH(D)      O POS Performed at [facility], [address] [place], [place] [address]   CBC     Status: None   Collection Time: 01/03/19  9:17 AM  Result Value Ref Range   WBC 4.7 4.0 - 10.5 K/uL   RBC 4.55 3.87 - 5.11 MIL/uL   Hemoglobin 13.1 12.0 - 15.0 g/dL   HCT 60.440.1 54.036.0  - 98.146.0 %   MCV 88.1 80.0 - 100.0 fL   MCH 28.8 26.0 - 34.0 pg   MCHC 32.7 30.0 - 36.0 g/dL   RDW 19.113.2 47.811.5 - 29.515.5 %   Platelets 294 150 - 400 K/uL   nRBC 0.0 0.0 - 0.2 %  hCG, quantitative, pregnancy     Status: Abnormal   Collection Time: 01/03/19  9:17 AM  Result Value Ref Range   hCG, Beta Chain, Quant, S 114 (H) <5 mIU/mL  Wet prep, genital     Status: Abnormal   Collection Time: 01/03/19  9:33 AM  Result Value Ref Range   Yeast Wet Prep HPF POC NONE SEEN NONE SEEN   Trich, Wet Prep NONE SEEN NONE SEEN   Clue Cells Wet Prep HPF POC NONE SEEN NONE SEEN   WBC, Wet Prep HPF POC FEW (A) NONE SEEN   Sperm NONE SEEN    [place] Ob Less Than 14 Weeks With Ob Transvaginal  Result Date: 01/03/2019 CLINICAL DATA:  Vaginal bleeding in first trimester of pregnancy; EGA [redacted] weeks 4 days by LMP of 11/25/2018; no quantitative beta HCG for correlation EXAM: OBSTETRIC <14 WK US AND TRANSVAGINAL OB US TECHNIQUE: Both transabdominal and transvaginal ultrasound examinations were performed for complete evaluation of the gestation as well as the maternal uterus, adnexal regions, and pelvic cul-de-sac. Transvaginal technique was performed to assess early pregnancy. COMPARISON:  None for this gestation FINDINGS: Intrauterine gestational sac: Absent Yolk sac:  N/A Embryo:  N/A Cardiac Activity: N/A Heart Rate: N/A  bpm MSD:   mm    w     d CRL:    mm    w    d                  US EDC: Subchorionic hemorrhage:  N/A Maternal uterus/adnexae: Uterus normal in appearance without evidence of mass or gestational sac. Endometrial complex normal appearance without gestational sac or endometrial fluid. RIGHT ovary normal size and morphology 2.6 x 1.3 x 1.9 cm. LEFT ovary normal size and morphology 2.1 x 1.7 x 1.7 cm. No free pelvic fluid or adnexal masses. IMPRESSION: No intrauterine gestation identified. Findings are compatible with pregnancy of unknown location. Differential diagnosis includes early intrauterine pregnancy too  early to visualize, spontaneous abortion, and ectopic pregnancy. Serial quantitative beta hCG and or followup ultrasound recommended to definitively exclude ectopic pregnancy. Electronically Signed   By: Angelyn PuntMark  Boles M.D.  On: 01/03/2019 10:28   MAU Course  Procedures  MDM Labs and [place] ordered and reviewed. No IUP or adnexal mass seen on [place], will low quant and VB most likely failed pregnancy but cannot exclude ectopic pregnancy or early pregnancy-discussed with pt. Will follow quant in 48 hrs. Stable for discharge home.   Assessment and Plan   1. Pregnancy, location unknown   2. Vaginal bleeding in pregnancy   3. Blood type, Rh positive    Discharge home Follow up in MAU on 01/05/19 for [facility] Ectopic/bleeding precautions  Allergies as of 01/03/2019   No Known Allergies     Medication List    STOP taking these medications   meclizine 25 MG tablet Commonly known as:  ANTIVERT   omeprazole 10 MG capsule Commonly known as:  [employer], CNM 01/03/2019, 10:49 AM

## 2019-01-04 LAB — GC/CHLAMYDIA PROBE AMP (~~LOC~~) NOT AT ARMC
CHLAMYDIA, DNA PROBE: NEGATIVE
Neisseria Gonorrhea: NEGATIVE

## 2019-01-05 ENCOUNTER — Inpatient Hospital Stay (HOSPITAL_COMMUNITY)
Admission: AD | Admit: 2019-01-05 | Discharge: 2019-01-05 | Disposition: A | Discharge status: Home / Self Care | Payer: BLUE CROSS/BLUE SHIELD | Source: Ambulatory Visit | Attending: Obstetrics and Gynecology | Admitting: Obstetrics and Gynecology

## 2019-01-05 DIAGNOSIS — O039 Complete or unspecified spontaneous abortion without complication: Secondary | ICD-10-CM | POA: Insufficient documentation

## 2019-01-05 LAB — HCG, QUANTITATIVE, PREGNANCY: hCG, Beta Chain, Quant, S: 37 m[IU]/mL — ABNORMAL HIGH (ref <5)

## 2019-01-05 NOTE — Discharge Instructions (Signed)
Miscarriage  A miscarriage is the loss of an unborn baby (fetus) before the 20th week of pregnancy. Most miscarriages happen during the first 3 months of pregnancy. Sometimes, a miscarriage can happen before a woman knows that she is pregnant.  Having a miscarriage can be an emotional experience. If you have had a miscarriage, talk with your health care provider about any questions you may have about miscarrying, the grieving process, and your plans for future pregnancy.  What are the causes?  A miscarriage may be caused by:  · Problems with the genes or chromosomes of the fetus. These problems make it impossible for the baby to develop normally. They are often the result of random errors that occur early in the development of the baby, and are not passed from parent to child (not inherited).  · Infection of the cervix or uterus.  · Conditions that affect hormone balance in the body.  · Problems with the cervix, such as the cervix opening and thinning before pregnancy is at term (cervical insufficiency).  · Problems with the uterus. These may include:  ? A uterus with an abnormal shape.  ? Fibroids in the uterus.  ? Congenital abnormalities. These are problems that were present at birth.  · Certain medical conditions.  · Smoking, drinking alcohol, or using drugs.  · Injury (trauma).  In many cases, the cause of a miscarriage is not known.  What are the signs or symptoms?  Symptoms of this condition include:  · Vaginal bleeding or spotting, with or without cramps or pain.  · Pain or cramping in the abdomen or lower back.  · Passing fluid, tissue, or blood clots from the vagina.  How is this diagnosed?  This condition may be diagnosed based on:  · A physical exam.  · Ultrasound.  · Blood tests.  · Urine tests.  How is this treated?  Treatment for a miscarriage is sometimes not necessary if you naturally pass all the tissue that was in your uterus. If necessary, this condition may be treated with:  · Dilation and  curettage (D&C). This is a procedure in which the cervix is stretched open and the lining of the uterus (endometrium) is scraped. This is done only if tissue from the fetus or placenta remains in the body (incomplete miscarriage).  · Medicines, such as:  ? Antibiotic medicine, to treat infection.  ? Medicine to help the body pass any remaining tissue.  ? Medicine to reduce (contract) the size of the uterus. These medicines may be given if you have a lot of bleeding.  If you have Rh negative blood and your baby was Rh positive, you will need a shot of a medicine called Rh immunoglobulinto protect your future babies from Rh blood problems. "Rh-negative" and "Rh-positive" refer to whether or not the blood has a specific protein found on the surface of red blood cells (Rh factor).  Follow these instructions at home:  Medicines    · Take over-the-counter and prescription medicines only as told by your health care provider.  · If you were prescribed antibiotic medicine, take it as told by your health care provider. Do not stop taking the antibiotic even if you start to feel better.  · Do not take NSAIDs, such as aspirin and ibuprofen, unless they are approved by your health care provider. These medicines can cause bleeding.  Activity  · Rest as directed. Ask your health care provider what activities are safe for you.  · Have someone   help with home and family responsibilities during this time.  General instructions  · Keep track of the number of sanitary pads you use each day and how soaked (saturated) they are. Write down this information.  · Monitor the amount of tissue or blood clots that you pass from your vagina. Save any large amounts of tissue for your health care provider to examine.  · Do not use tampons, douche, or have sex until your health care provider approves.  · To help you and your partner with the process of grieving, talk with your health care provider or seek counseling.  · When you are ready, meet with  your health care provider to discuss any important steps you should take for your health. Also, discuss steps you should take to have a healthy pregnancy in the future.  · Keep all follow-up visits as told by your health care provider. This is important.  Where to find more information  · The American Congress of Obstetricians and Gynecologists: www.acog.org  · [place] Department of Health and Human Services Office of Women’s Health: www.womenshealth.gov  Contact a health care provider if:  · You have a fever or chills.  · You have a foul smelling vaginal discharge.  · You have more bleeding instead of less.  Get help right away if:  · You have severe cramps or pain in your back or abdomen.  · You pass blood clots or tissue from your vagina that is walnut-sized or larger.  · You soak more than 1 regular sanitary pad in an hour.  · You become light-headed or weak.  · You pass out.  · You have feelings of sadness that take over your thoughts, or you have thoughts of hurting yourself.  Summary  · Most miscarriages happen in the first 3 months of pregnancy. Sometimes miscarriage happens before a woman even knows that she is pregnant.  · Follow your health care provider's instruction for home care. Keep all follow-up appointments.  · To help you and your partner with the process of grieving, talk with your health care provider or seek counseling.  This information is not intended to replace advice given to you by your health care provider. Make sure you discuss any questions you have with your health care provider.  Document Released: 05/17/2001 Document Revised: 12/27/2016 Document Reviewed: 12/27/2016  Elsevier Interactive Patient Education © 2019 [employer]

## 2019-01-05 NOTE — MAU Provider Note (Signed)
History   Chief Complaint:  Follow-up   Michelle Burns is  38 y.o. [id number] Patient's last menstrual period was 11/25/2018.Marland Kitchen Patient is here for follow up of quantitative HCG and ongoing surveillance of pregnancy status. She is [redacted]w[redacted]d weeks gestation  by LMP.    Since her last visit, the patient is without new complaint. The patient reports bleeding as  none now.  She denies any pain.  General ROS:  negative  Her previous Quantitative HCG values are:  Results for Michelle Burns (MRN [id number]) as of 01/05/2019 18:14  Ref. Range 01/03/2019 09:17  HCG, Beta Chain, Quant, S Latest Ref Range: <5 mIU/mL 114 (H)   Physical Exam   Blood pressure 107/69, pulse 71, temperature 98.4 F (36.9 C), temperature source Oral, resp. rate 19, last menstrual period 11/25/2018, SpO2 100 %.  Focused Gynecological Exam: examination not indicated  Labs: Results for orders placed or performed during the hospital encounter of 01/05/19 (from the past 24 hour(s))  hCG, quantitative, pregnancy   Collection Time: 01/05/19  4:07 PM  Result Value Ref Range   hCG, Beta Chain, Quant, S 37 (H) <5 mIU/mL   Assessment:   1. Miscarriage     More than 50% drop in HCG over 48 hours  Plan: -Discharge home in stable condition -Abdominal pain and vaginal bleeding precautions discussed -Patient advised to follow-up with [facility] in 1 week for repeat HCG and 2 weeks with a provider -Patient may return to MAU as needed or if her condition were to change or worsen  Michelle Burns, CNM 01/05/2019, 5:00 PM

## 2019-01-05 NOTE — MAU Note (Signed)
Michelle Burns is a 38 y.o. at [redacted]w[redacted]d here in MAU reporting: for follow up HCG lab work. Pain score: denies Vaginal bleeding: reports that she is still having bleeding and is about the same as her last visit. Vitals:   01/05/19 1644  BP: 107/69  Pulse: 71  Resp: 19  Temp: 98.4 F (36.9 C)  SpO2: 100%     Patient to lobby to await results.

## 2019-01-07 ENCOUNTER — Telehealth: Payer: Self-pay | Admitting: Family Medicine

## 2019-01-07 NOTE — Telephone Encounter (Signed)
Called pt to get her scheduled for her 1 week hcg and SAB f/u in 2 weeks. Pt stated that she already has an appt with her OBGYN because she can only be since with doctors within her network.

## 2019-10-10 ENCOUNTER — Other Ambulatory Visit: Payer: Self-pay

## 2019-10-10 DIAGNOSIS — Z20822 Contact with and (suspected) exposure to covid-19: Secondary | ICD-10-CM

## 2019-10-12 LAB — NOVEL CORONAVIRUS, NAA: SARS-CoV-2, NAA: NOT DETECTED

## 2019-10-29 ENCOUNTER — Other Ambulatory Visit: Payer: Self-pay

## 2019-10-29 DIAGNOSIS — Z20822 Contact with and (suspected) exposure to covid-19: Secondary | ICD-10-CM

## 2019-10-31 LAB — NOVEL CORONAVIRUS, NAA: SARS-CoV-2, NAA: DETECTED — AB

## 2021-02-17 ENCOUNTER — Other Ambulatory Visit: Payer: Self-pay | Admitting: Orthopedic Surgery

## 2021-02-17 DIAGNOSIS — M4722 Other spondylosis with radiculopathy, cervical region: Secondary | ICD-10-CM

## 2021-03-16 ENCOUNTER — Ambulatory Visit
Admission: RE | Admit: 2021-03-16 | Discharge: 2021-03-16 | Disposition: A | Discharge status: Home / Self Care | Payer: 59 | Source: Ambulatory Visit | Attending: Orthopedic Surgery | Admitting: Orthopedic Surgery

## 2021-03-16 DIAGNOSIS — M4722 Other spondylosis with radiculopathy, cervical region: Secondary | ICD-10-CM

## 2022-03-10 ENCOUNTER — Emergency Department
Admission: EM | Admit: 2022-03-10 | Discharge: 2022-03-10 | Disposition: A | Discharge status: Home / Self Care | Payer: 59 | Source: Home / Self Care | Attending: Family Medicine | Admitting: Family Medicine

## 2022-03-10 ENCOUNTER — Other Ambulatory Visit (HOSPITAL_COMMUNITY)
Admission: RE | Admit: 2022-03-10 | Discharge: 2022-03-10 | Disposition: A | Discharge status: Home / Self Care | Payer: 59 | Source: Ambulatory Visit | Attending: Family Medicine | Admitting: Family Medicine

## 2022-03-10 DIAGNOSIS — N898 Other specified noninflammatory disorders of vagina: Secondary | ICD-10-CM

## 2022-03-10 DIAGNOSIS — Z973 Presence of spectacles and contact lenses: Secondary | ICD-10-CM

## 2022-03-10 DIAGNOSIS — H1031 Unspecified acute conjunctivitis, right eye: Secondary | ICD-10-CM | POA: Diagnosis not present

## 2022-03-10 MED ORDER — CIPROFLOXACIN HCL 0.3 % OP SOLN: OPHTHALMIC | 0 refills | Status: DC

## 2022-03-10 NOTE — ED Triage Notes (Signed)
Pt here today c/o RT eye redness since waking up yesterday. Does wear contacts. Also having some nasal congestion, not sure if related. States shes also has some vaginal itching and would like to be swabbed for BV.  ?

## 2022-03-10 NOTE — Discharge Instructions (Signed)
See eye doctor if irritation persists or worsens ?No contacs until infection clear ?

## 2022-03-10 NOTE — ED Provider Notes (Signed)
?[facility] ? ? ? ?CSN: [id number] ?Arrival date & time: 03/10/22  1054 ? ? ?  ? ?History   ?Chief Complaint ?Chief Complaint  ?Patient presents with  ? Conjunctivitis  ? Vaginal Itching  ? ? ?HPI ?Michelle Burns is a 41 y.o. female.  ? ?HPI ?Patient is here for pinkeye.  Her right eye is irritated.  She wears contact lenses.  She noticed when she took the contact out.  She states that she supposed to take them out at night but she does not always remember.  She has not had trouble with infections or keratitis in the past.  Her vision is normal.  There is no tearing or discharge.  Her eye is red and irritated.  Her vision is normal ?She also has some vaginal itching and desires testing for "BV".  Denies need for STD testing ?Past Medical History:  ?Diagnosis Date  ? History of sleeve gastrectomy   ? Medical history non-contributory   ? ? ?There are no problems to display for this patient. ? ? ?Past Surgical History:  ?Procedure Laterality Date  ? CESAREAN SECTION    ? LAPAROSCOPIC GASTRIC SLEEVE RESECTION    ? ? ?OB History   ? ? Gravida  ?4  ? Para  ?1  ? Term  ?1  ? Preterm  ?   ? AB  ?2  ? Living  ?1  ?  ? ? SAB  ?   ? IAB  ?1  ? Ectopic  ?   ? Multiple  ?   ? Live Births  ?   ?   ?  ?  ? ? ? ?Home Medications   ? ?Prior to Admission medications   ?Medication Sig Start Date End Date Taking? Authorizing Provider  ?ciprofloxacin (CILOXAN) 0.3 % ophthalmic solution Use eye drops every 4 hours while awake 03/10/22  Yes Eustace Moore, MD  ?omeprazole (PRILOSEC) 10 MG capsule Take 10 mg by mouth daily.    [provider]  ? ? ?Family History ?Family History  ?Problem Relation Age of Onset  ? Hypertension Mother   ? Diabetes Father   ? Hypertension Father   ? ? ?Social History ?Social History  ? ?Tobacco Use  ? Smoking status: Never  ? Smokeless tobacco: Never  ?Vaping Use  ? Vaping Use: Never used  ?Substance Use Topics  ? Alcohol use: Yes  ?  Alcohol/week: 1.0 standard drink  ?  Types: 1  Shots of liquor per week  ?  Comment: Socially  ? Drug use: Never  ? ? ? ?Allergies   ?Patient has no known allergies. ? ? ?Review of Systems ?Review of Systems ? ?See HPI ?Physical Exam ?Triage Vital Signs ?ED Triage Vitals  ?Enc Vitals Group  ?   BP 03/10/22 1105 122/80  ?   Pulse Rate 03/10/22 1105 93  ?   Resp 03/10/22 1105 17  ?   Temp 03/10/22 1105 98 ?F (36.7 ?C)  ?   Temp Source 03/10/22 1105 Oral  ?   SpO2 03/10/22 1105 99 %  ?   Weight --   ?   Height --   ?   Head Circumference --   ?   Peak Flow --   ?   Pain Score 03/10/22 1107 1  ?   Pain Loc --   ?   Pain Edu? --   ?   Excl. in GC? --   ? ?No data found. ? ?  Updated Vital Signs ?BP 122/80 (BP Location: Right Arm)   Pulse 93   Temp 98 ?F (36.7 ?C) (Oral)   Resp 17   LMP  (LMP Unknown)   SpO2 99%  ? ?    ? ?Physical Exam ?Constitutional:   ?   General: She is not in acute distress. ?   Appearance: She is well-developed.  ?HENT:  ?   Head: Normocephalic and atraumatic.  ?   Mouth/Throat:  ?   Comments: Mask is in place ?Eyes:  ?   Pupils: Pupils are equal, round, and reactive to light.  ?   Comments: Right eye has mild conjunctival injection.  No discharge.  No visible foreign body is seen.  ?Cardiovascular:  ?   Rate and Rhythm: Normal rate.  ?Pulmonary:  ?   Effort: Pulmonary effort is normal. No respiratory distress.  ?Abdominal:  ?   General: There is no distension.  ?   Palpations: Abdomen is soft.  ?Musculoskeletal:     ?   General: Normal range of motion.  ?   Cervical back: Normal range of motion.  ?Skin: ?   General: Skin is warm and dry.  ?Neurological:  ?   Mental Status: She is alert.  ?Psychiatric:     ?   Mood and Affect: Mood normal.     ?   Behavior: Behavior normal.  ? ? ? ?UC Treatments / Results  ?Labs ?(all labs ordered are listed, but only abnormal results are displayed) ?Labs Reviewed  ?CERVICOVAGINAL ANCILLARY ONLY  ? ? ?EKG ? ? ?Radiology ?No results found. ? ?Procedures ?Procedures (including critical care  time) ? ?Medications Ordered in UC ?Medications - No data to display ? ?Initial Impression / Assessment and Plan / UC Course  ?I have reviewed the triage vital signs and the nursing notes. ? ?Pertinent labs & imaging results that were available during my care of the patient were reviewed by me and considered in my medical decision making (see chart for details). ? ?  ? ?Discussed that patient needs to use good handwashing.  Needs to change out her contacts frequently.  Needs to follow-up with her eye doctor.  We will treat her conjunctivitis/keratitis with Cipro drops.  Return as needed ?BV swab is sent.  We will treat if positive ?Final Clinical Impressions(s) / UC Diagnoses  ? ?Final diagnoses:  ?Vaginal itching  ?Acute conjunctivitis of right eye, unspecified acute conjunctivitis type  ?Wears contact lenses  ? ? ? ?Discharge Instructions   ? ?  ?See eye doctor if irritation persists or worsens ?No contacs until infection clear ? ? ?ED Prescriptions   ? ? Medication Sig Dispense Auth. Provider  ? ciprofloxacin (CILOXAN) 0.3 % ophthalmic solution Use eye drops every 4 hours while awake 5 mL Eustace Moore, MD  ? ?  ? ?PDMP not reviewed this encounter. ?  ?Eustace Moore, MD ?03/10/22 1133 ? ?

## 2022-03-11 LAB — CERVICOVAGINAL ANCILLARY ONLY
Bacterial Vaginitis (gardnerella): NEGATIVE
Candida Glabrata: NEGATIVE
Candida Vaginitis: NEGATIVE
Chlamydia: NEGATIVE
Comment: NEGATIVE
Comment: NEGATIVE
Comment: NEGATIVE
Comment: NEGATIVE
Comment: NEGATIVE
Comment: NORMAL
Neisseria Gonorrhea: NEGATIVE
Trichomonas: NEGATIVE

## 2022-04-19 ENCOUNTER — Emergency Department
Admission: EM | Admit: 2022-04-19 | Discharge: 2022-04-19 | Disposition: A | Discharge status: Home / Self Care | Payer: 59 | Source: Home / Self Care

## 2022-04-19 ENCOUNTER — Other Ambulatory Visit (HOSPITAL_COMMUNITY)
Admission: RE | Admit: 2022-04-19 | Discharge: 2022-04-19 | Disposition: A | Discharge status: Home / Self Care | Payer: 59 | Source: Ambulatory Visit | Attending: Family Medicine | Admitting: Family Medicine

## 2022-04-19 DIAGNOSIS — N3000 Acute cystitis without hematuria: Secondary | ICD-10-CM | POA: Diagnosis not present

## 2022-04-19 DIAGNOSIS — N3001 Acute cystitis with hematuria: Secondary | ICD-10-CM

## 2022-04-19 DIAGNOSIS — N898 Other specified noninflammatory disorders of vagina: Secondary | ICD-10-CM | POA: Diagnosis present

## 2022-04-19 LAB — POCT URINALYSIS DIP (MANUAL ENTRY)
Bilirubin, UA: NEGATIVE
Glucose, UA: NEGATIVE mg/dL
Nitrite, UA: NEGATIVE
Spec Grav, UA: 1.025 (ref 1.010–1.025)
Urobilinogen, UA: 2 E.U./dL — AB
pH, UA: 7 (ref 5.0–8.0)

## 2022-04-19 MED ORDER — SULFAMETHOXAZOLE-TRIMETHOPRIM 800-160 MG PO TABS: 1.0000 | ORAL_TABLET | Freq: Two times a day (BID) | ORAL | 0 refills | Status: AC

## 2022-04-19 MED ORDER — FLUCONAZOLE 200 MG PO TABS: ORAL_TABLET | ORAL | 0 refills | Status: DC

## 2022-04-19 NOTE — ED Provider Notes (Signed)
?[facility] ? ? ? ?CSN: [id number] ?Arrival date & time: 04/19/22  1105 ? ? ?  ? ?History   ?Chief Complaint ?Chief Complaint  ?Patient presents with  ? Vaginal Itching  ?  Vaginal itching and vaginal discomfort. X2 days  ? ? ?HPI ?Kailena Fonnie BirkenheadM Magistro is a 41 y.o. female.  ? ?HPI Pleasant 41 year old female presents with vaginal itching and vaginal discomfort x 2 days.  PMH significant for history of sleeve gastrectomy. ? ?Past Medical History:  ?Diagnosis Date  ? History of sleeve gastrectomy   ? Medical history non-contributory   ? ? ?There are no problems to display for this patient. ? ? ?Past Surgical History:  ?Procedure Laterality Date  ? CESAREAN SECTION    ? LAPAROSCOPIC GASTRIC SLEEVE RESECTION    ? ? ?OB History   ? ? Gravida  ?4  ? Para  ?1  ? Term  ?1  ? Preterm  ?   ? AB  ?2  ? Living  ?1  ?  ? ? SAB  ?   ? IAB  ?1  ? Ectopic  ?   ? Multiple  ?   ? Live Births  ?   ?   ?  ?  ? ? ? ?Home Medications   ? ?Prior to Admission medications   ?Medication Sig Start Date End Date Taking? Authorizing Provider  ?Cholecalciferol 25 MCG (1000 UT) capsule Take by mouth. 04/19/16  Yes [provider]  ?Multiple Vitamins-Minerals (MULTIVITAMIN WITH MINERALS) tablet Take 1 tablet by mouth daily.   Yes [provider]  ?norethindrone-ethinyl estradiol-FE (LOESTRIN FE) 1-20 MG-MCG tablet TAKE 1 TABLET BY MOUTH EVERY DAY CONTINUOUSLY (SKIP PLACEBO TABLETS) 04/18/22  Yes [provider]  ?ciprofloxacin (CILOXAN) 0.3 % ophthalmic solution Use eye drops every 4 hours while awake 03/10/22   Eustace MooreNelson, Yvonne Sue, MD  ?fluconazole (DIFLUCAN) 200 MG tablet Take 1 tab p.o. now, may repeat 1 tab p.o. in 3 days if symptoms are not resolved. 04/19/22  Yes Trevor Ihaagan, Aranza Geddes, FNP  ?omeprazole (PRILOSEC) 10 MG capsule Take 10 mg by mouth daily.    [provider]  ?sulfamethoxazole-trimethoprim (BACTRIM DS) 800-160 MG tablet Take 1 tablet by mouth 2 (two) times daily for 3 days. 04/19/22 04/22/22 Yes  Trevor Ihaagan, Alexis Mizuno, FNP  ? ? ?Family History ?Family History  ?Problem Relation Age of Onset  ? Hypertension Mother   ? Diabetes Father   ? Hypertension Father   ? ? ?Social History ?Social History  ? ?Tobacco Use  ? Smoking status: Never  ? Smokeless tobacco: Never  ?Vaping Use  ? Vaping Use: Never used  ?Substance Use Topics  ? Alcohol use: Yes  ?  Alcohol/week: 1.0 standard drink  ?  Types: 1 Shots of liquor per week  ?  Comment: Socially  ? Drug use: Never  ? ? ? ?Allergies   ?Patient has no known allergies. ? ? ?Review of Systems ?Review of Systems  ?Genitourinary:  Positive for vaginal pain.  ?     Vaginal itching x 2 days  ? ? ?Physical Exam ?Triage Vital Signs ?ED Triage Vitals  ?Enc Vitals Group  ?   BP 04/19/22 1118 110/75  ?   Pulse Rate 04/19/22 1118 89  ?   Resp 04/19/22 1118 18  ?   Temp 04/19/22 1118 98.1 ?F (36.7 ?C)  ?   Temp Source 04/19/22 1118 Oral  ?   SpO2 04/19/22 1118 100 %  ?   Weight  04/19/22 1116 167 lb (75.8 kg)  ?   Height 04/19/22 1116 4\' 11"  (1.499 m)  ?   Head Circumference --   ?   Peak Flow --   ?   Pain Score 04/19/22 1116 8  ?   Pain Loc --   ?   Pain Edu? --   ?   Excl. in GC? --   ? ?No data found. ? ?Updated Vital Signs ?BP 110/75 (BP Location: Left Arm)   Pulse 89   Temp 98.1 ?F (36.7 ?C) (Oral)   Resp 18   Ht 4\' 11"  (1.499 m)   Wt 167 lb (75.8 kg)   SpO2 100%   BMI 33.73 kg/m?  ? ?  ? ?Physical Exam ?Vitals and nursing note reviewed.  ?Constitutional:   ?   Appearance: Normal appearance. She is normal weight.  ?HENT:  ?   Head: Normocephalic and atraumatic.  ?   Mouth/Throat:  ?   Mouth: Mucous membranes are moist.  ?   Pharynx: Oropharynx is clear.  ?Eyes:  ?   Extraocular Movements: Extraocular movements intact.  ?   Conjunctiva/sclera: Conjunctivae normal.  ?   Pupils: Pupils are equal, round, and reactive to light.  ?Cardiovascular:  ?   Rate and Rhythm: Normal rate and regular rhythm.  ?   Pulses: Normal pulses.  ?   Heart sounds: Normal heart sounds.  ?Pulmonary:  ?    Effort: Pulmonary effort is normal.  ?   Breath sounds: Normal breath sounds. No wheezing, rhonchi or rales.  ?Musculoskeletal:  ?   Cervical back: Normal range of motion and neck supple.  ?Skin: ?   General: Skin is warm and dry.  ?Neurological:  ?   General: No focal deficit present.  ?   Mental Status: She is alert and oriented to person, place, and time. Mental status is at baseline.  ? ? ? ?UC Treatments / Results  ?Labs ?(all labs ordered are listed, but only abnormal results are displayed) ?Labs Reviewed  ?POCT URINALYSIS DIP (MANUAL ENTRY) - Abnormal; Notable for the following components:  ?    Result Value  ? Ketones, POC UA trace (5) (*)   ? Blood, UA trace-intact (*)   ? Protein Ur, POC trace (*)   ? Urobilinogen, UA 2.0 (*)   ? Leukocytes, UA Trace (*)   ? All other components within normal limits  ?CERVICOVAGINAL ANCILLARY ONLY  ? ? ?EKG ? ? ?Radiology ?No results found. ? ?Procedures ?Procedures (including critical care time) ? ?Medications Ordered in UC ?Medications - No data to display ? ?Initial Impression / Assessment and Plan / UC Course  ?I have reviewed the triage vital signs and the nursing notes. ? ?Pertinent labs & imaging results that were available during my care of the patient were reviewed by me and considered in my medical decision making (see chart for details). ? ?  ? ?MDM: 1.  Vaginal itching-Rx'd Diflucan; 2.  Acute cystitis with hematuria-Rx'd Bactrim. Instructed patient to take medication as directed with food to completion.  Encouraged patient to increase daily water intake while taking these medications.  Advised patient we will follow-up with her once urine culture results return.  Patient discharged home, hemodynamically stable. ?Final Clinical Impressions(s) / UC Diagnoses  ? ?Final diagnoses:  ?Vaginal itching  ?Acute cystitis with hematuria  ? ? ? ?Discharge Instructions   ? ?  ?Instructed patient to take medication as directed with food to completion.  Encouraged patient  to increase daily water intake while taking these medications.  Advised patient we will follow-up with her once urine culture results return. ? ? ? ? ?ED Prescriptions   ? ? Medication Sig Dispense Auth. Provider  ? sulfamethoxazole-trimethoprim (BACTRIM DS) 800-160 MG tablet Take 1 tablet by mouth 2 (two) times daily for 3 days. 6 tablet Trevor Iha, FNP  ? fluconazole (DIFLUCAN) 200 MG tablet Take 1 tab p.o. now, may repeat 1 tab p.o. in 3 days if symptoms are not resolved. 7 tablet Trevor Iha, FNP  ? ?  ? ?PDMP not reviewed this encounter. ?  ?Trevor Iha, FNP ?04/19/22 1206 ? ?

## 2022-04-19 NOTE — ED Triage Notes (Signed)
Pt states that she has some vaginal itching and vaginal discomfort. X2 days ?

## 2022-04-19 NOTE — Discharge Instructions (Addendum)
Instructed patient to take medication as directed with food to completion.  Encouraged patient to increase daily water intake while taking these medications.  Advised patient we will follow-up with her once urine culture results return. ?

## 2022-04-20 LAB — CERVICOVAGINAL ANCILLARY ONLY
Bacterial Vaginitis (gardnerella): NEGATIVE
Candida Glabrata: NEGATIVE
Candida Vaginitis: POSITIVE — AB
Chlamydia: NEGATIVE
Comment: NEGATIVE
Comment: NEGATIVE
Comment: NEGATIVE
Comment: NEGATIVE
Comment: NEGATIVE
Comment: NORMAL
Neisseria Gonorrhea: NEGATIVE
Trichomonas: NEGATIVE

## 2022-05-08 ENCOUNTER — Encounter: Payer: Self-pay | Admitting: Emergency Medicine

## 2022-05-08 ENCOUNTER — Emergency Department
Admission: EM | Admit: 2022-05-08 | Discharge: 2022-05-08 | Disposition: A | Discharge status: Home / Self Care | Payer: 59 | Source: Home / Self Care

## 2022-05-08 ENCOUNTER — Other Ambulatory Visit: Payer: Self-pay

## 2022-05-08 DIAGNOSIS — N939 Abnormal uterine and vaginal bleeding, unspecified: Secondary | ICD-10-CM

## 2022-05-08 DIAGNOSIS — N926 Irregular menstruation, unspecified: Secondary | ICD-10-CM

## 2022-05-08 LAB — POCT URINE PREGNANCY: Preg Test, Ur: NEGATIVE

## 2022-05-08 MED ORDER — LEVONORGEST-ETH ESTRAD 91-DAY 0.15-0.03 &0.01 MG PO TABS: 1.0000 | ORAL_TABLET | Freq: Every day | ORAL | 1 refills | Status: AC

## 2022-05-08 NOTE — ED Provider Notes (Signed)
[facility]    CSN: [id number] Arrival date & time: 05/08/22  1432      History   Chief Complaint Chief Complaint  Patient presents with   Menstrual Problem    HPI Michelle Burns is a 41 y.o. female.   Patient presents today with abnormal uterine bleeding.  She reports that for the past 6 weeks she has had persistent spotting.  She reports having an episode of this last year but it resolved prior to her seeing her OB/GYN.  She is currently using OCP (lo Loestrin Fe) and reports occasionally missing doses.  She is interested in changing her birth control in the hopes that this would provide better control of her cycles.  She does not smoke and denies any personal or family history of VTE event.  She is confident that she is not pregnant.  She denies any heavy blood loss/menorrhagia or having to change her menstrual product frequently.  She has no concern for anemia and reports that she had significant blood work with [employer] [facility] recently that was all normal.  This included hormones as well as CBC/CMP.  She is not interested in additional testing today.  She denies any fatigue, shortness of breath, chest pain, palpitations.   Past Medical History:  Diagnosis Date   History of sleeve gastrectomy    Medical history non-contributory     There are no problems to display for this patient.   Past Surgical History:  Procedure Laterality Date   CESAREAN SECTION     LAPAROSCOPIC GASTRIC SLEEVE RESECTION      OB History     Gravida  4   Para  1   Term  1   Preterm      AB  2   Living  1      SAB      IAB  1   Ectopic      Multiple      Live Births               Home Medications    Prior to Admission medications   Medication Sig Start Date End Date Taking? Authorizing Provider  Levonorgestrel-Ethinyl Estradiol (SEASONIQUE) 0.15-0.03 &0.01 MG tablet Take 1 tablet by mouth daily. 05/08/22  Yes Brynda Heick, Noberto Retort, PA-C  Cholecalciferol 25 MCG (1000  UT) capsule Take by mouth. 04/19/16   [provider]  Multiple Vitamins-Minerals (MULTIVITAMIN WITH MINERALS) tablet Take 1 tablet by mouth daily.    [provider]  omeprazole (PRILOSEC) 10 MG capsule Take 10 mg by mouth daily.    [provider]    Family History Family History  Problem Relation Age of Onset   Hypertension Mother    Diabetes Father    Hypertension Father     Social History Social History   Tobacco Use   Smoking status: Never   Smokeless tobacco: Never  Vaping Use   Vaping Use: Never used  Substance Use Topics   Alcohol use: Yes    Alcohol/week: 1.0 standard drink    Types: 1 Shots of liquor per week    Comment: Socially   Drug use: Never     Allergies   Patient has no known allergies.   Review of Systems Review of Systems  Constitutional:  Negative for activity change, appetite change, fatigue and fever.  Respiratory:  Negative for shortness of breath.   Cardiovascular:  Negative for chest pain and palpitations.  Gastrointestinal:  Negative for abdominal pain, diarrhea,  nausea and vomiting.  Genitourinary:  Positive for menstrual problem and vaginal bleeding. Negative for dysuria, frequency, pelvic pain, urgency, vaginal discharge and vaginal pain.  Neurological:  Negative for dizziness, light-headedness and headaches.    Physical Exam Triage Vital Signs ED Triage Vitals  Enc Vitals Group     BP 05/08/22 1500 123/85     Pulse Rate 05/08/22 1500 87     Resp 05/08/22 1500 18     Temp 05/08/22 1500 98.6 F (37 C)     Temp Source 05/08/22 1500 Oral     SpO2 05/08/22 1500 100 %     Weight --      Height --      Head Circumference --      Peak Flow --      Pain Score 05/08/22 1457 2     Pain Loc --      Pain Edu? --      Excl. in GC? --    No data found.  Updated Vital Signs BP 123/85 (BP Location: Right Arm)   Pulse 87   Temp 98.6 F (37 C) (Oral)   Resp 18   SpO2 100%   Visual Acuity Right Eye  Distance:   Left Eye Distance:   Bilateral Distance:    Right Eye Near:   Left Eye Near:    Bilateral Near:     Physical Exam Vitals reviewed.  Constitutional:      General: She is awake. She is not in acute distress.    Appearance: Normal appearance. She is well-developed. She is not ill-appearing.     Comments: Very pleasant female appears stated age in no acute distress sitting comfortably in exam room  HENT:     Head: Normocephalic and atraumatic.  Cardiovascular:     Rate and Rhythm: Normal rate and regular rhythm.     Heart sounds: Normal heart sounds, S1 normal and S2 normal. No murmur heard. Pulmonary:     Effort: Pulmonary effort is normal.     Breath sounds: Normal breath sounds. No wheezing, rhonchi or rales.     Comments: Clear to auscultation bilaterally Abdominal:     General: Bowel sounds are normal.     Palpations: Abdomen is soft.     Tenderness: There is no abdominal tenderness. There is no right CVA tenderness, left CVA tenderness, guarding or rebound.     Comments: Benign abdominal exam  Psychiatric:        Behavior: Behavior is cooperative.     UC Treatments / Results  Labs (all labs ordered are listed, but only abnormal results are displayed) Labs Reviewed  POCT URINE PREGNANCY - Normal    EKG   Radiology No results found.  Procedures Procedures (including critical care time)  Medications Ordered in UC Medications - No data to display  Initial Impression / Assessment and Plan / UC Course  I have reviewed the triage vital signs and the nursing notes.  Pertinent labs & imaging results that were available during my care of the patient were reviewed by me and considered in my medical decision making (see chart for details).     Urine present test was negative in clinic today.  Patient is well-appearing, afebrile, nontoxic, nontachycardic.  Offered blood work including thyroid, CBC, CMP but she declined this.  She recently had blood work.   We will try slightly higher level of estrogen to see if this will help manage symptoms.  Prescription for Solmon Ice was sent to pharmacy  and she was instructed to discontinue lo Loestrin.  Recommended she follow-up with OB/GYN and encouraged her to call to schedule an appointment.  If she develops any worsening symptoms she is to return for reevaluation.  Strict return precautions given to which she expressed understanding.  She denies history of smoking or risk factors for VTE event.  Final Clinical Impressions(s) / UC Diagnoses   Final diagnoses:  Abnormal uterine bleeding (AUB)  Menstrual abnormality     Discharge Instructions      Your urine pregnancy test was negative.  We are going to start a different birth control.  Please start Seasonique which was sent to the pharmacy.  This has a slightly higher level of estrogen so hopefully will help manage your symptoms.  I do recommend that you set an alarm on your phone to help remember to take this daily.  Please follow-up with your OB/GYN.  If you develop any worsening symptoms or if anything changes please return for reevaluation.     ED Prescriptions     Medication Sig Dispense Auth. Provider   Levonorgestrel-Ethinyl Estradiol (SEASONIQUE) 0.15-0.03 &0.01 MG tablet Take 1 tablet by mouth daily. 91 tablet Amry Cathy, Noberto RetortErin K, PA-C      PDMP not reviewed this encounter.   Jeani HawkingRaspet, Nicolasa Milbrath K, PA-C 05/08/22 1556

## 2022-05-08 NOTE — Discharge Instructions (Signed)
Your urine pregnancy test was negative.  We are going to start a different birth control.  Please start Seasonique which was sent to the pharmacy.  This has a slightly higher level of estrogen so hopefully will help manage your symptoms.  I do recommend that you set an alarm on your phone to help remember to take this daily.  Please follow-up with your OB/GYN.  If you develop any worsening symptoms or if anything changes please return for reevaluation.

## 2022-05-08 NOTE — ED Triage Notes (Signed)
Patient presents to Urgent Care with complaints of continuous vaginal bleeding since 6 weeks. Patient reports having a longer period of menstrual cycle. She has changed her OCP due to insurance not covering. She is currently on Lo Loestrin FE. Will miss a pill and will have a cycle. She is looking for a new GYN due to insurance change. She was previously with [facility].
